# Patient Record
Sex: Female | Born: 1945 | Race: Black or African American | Hispanic: No | Smoking: Former smoker
Health system: Southern US, Community
[De-identification: ages and names within clinical notes are randomized; demographics above are authoritative.]

## PROBLEM LIST (undated history)

## (undated) DIAGNOSIS — Z79890 Hormone replacement therapy (postmenopausal): Secondary | ICD-10-CM

## (undated) DIAGNOSIS — M1990 Unspecified osteoarthritis, unspecified site: Secondary | ICD-10-CM

## (undated) DIAGNOSIS — R9431 Abnormal electrocardiogram [ECG] [EKG]: Secondary | ICD-10-CM

## (undated) DIAGNOSIS — R42 Dizziness and giddiness: Secondary | ICD-10-CM

## (undated) DIAGNOSIS — M259 Joint disorder, unspecified: Secondary | ICD-10-CM

## (undated) DIAGNOSIS — R51 Headache: Secondary | ICD-10-CM

## (undated) HISTORY — DX: Headache: R51

## (undated) HISTORY — DX: Abnormal electrocardiogram (ECG) (EKG): R94.31

## (undated) HISTORY — DX: Joint disorder, unspecified: M25.9

## (undated) HISTORY — PX: OTHER SURGICAL HISTORY: SHX169

## (undated) HISTORY — PX: CATARACT EXTRACTION: SUR2

## (undated) HISTORY — PX: ABDOMINAL HYSTERECTOMY: SHX81

## (undated) HISTORY — DX: Hormone replacement therapy: Z79.890

## (undated) HISTORY — PX: TUBAL LIGATION: SHX77

## (undated) HISTORY — DX: Dizziness and giddiness: R42

---

## 1996-08-14 ENCOUNTER — Encounter: Payer: Self-pay | Admitting: Internal Medicine

## 1999-03-26 ENCOUNTER — Encounter: Payer: Self-pay | Admitting: Internal Medicine

## 1999-03-26 ENCOUNTER — Ambulatory Visit (HOSPITAL_COMMUNITY): Admission: RE | Admit: 1999-03-26 | Discharge: 1999-03-26 | Payer: Self-pay | Admitting: Internal Medicine

## 2001-08-12 ENCOUNTER — Encounter: Payer: Self-pay | Admitting: Internal Medicine

## 2001-08-12 ENCOUNTER — Ambulatory Visit (HOSPITAL_COMMUNITY): Admission: RE | Admit: 2001-08-12 | Discharge: 2001-08-12 | Payer: Self-pay | Admitting: Internal Medicine

## 2004-11-14 ENCOUNTER — Ambulatory Visit: Payer: Self-pay | Admitting: Internal Medicine

## 2004-11-29 ENCOUNTER — Ambulatory Visit: Payer: Self-pay | Admitting: Internal Medicine

## 2005-02-16 ENCOUNTER — Ambulatory Visit: Payer: Self-pay | Admitting: Family Medicine

## 2005-02-16 ENCOUNTER — Ambulatory Visit (HOSPITAL_COMMUNITY): Admission: RE | Admit: 2005-02-16 | Discharge: 2005-02-16 | Payer: Self-pay | Admitting: Family Medicine

## 2005-09-24 ENCOUNTER — Ambulatory Visit: Payer: Self-pay | Admitting: Internal Medicine

## 2006-01-08 ENCOUNTER — Emergency Department (HOSPITAL_COMMUNITY): Admission: EM | Admit: 2006-01-08 | Discharge: 2006-01-08 | Payer: Self-pay

## 2006-01-12 ENCOUNTER — Emergency Department (HOSPITAL_COMMUNITY): Admission: EM | Admit: 2006-01-12 | Discharge: 2006-01-13 | Payer: Self-pay | Admitting: Emergency Medicine

## 2006-02-27 ENCOUNTER — Ambulatory Visit (HOSPITAL_BASED_OUTPATIENT_CLINIC_OR_DEPARTMENT_OTHER): Admission: RE | Admit: 2006-02-27 | Discharge: 2006-02-27 | Payer: Self-pay | Admitting: Orthopedic Surgery

## 2006-05-08 ENCOUNTER — Ambulatory Visit: Payer: Self-pay | Admitting: Internal Medicine

## 2007-01-14 ENCOUNTER — Telehealth: Payer: Self-pay | Admitting: Family Medicine

## 2007-01-19 DIAGNOSIS — E079 Disorder of thyroid, unspecified: Secondary | ICD-10-CM | POA: Insufficient documentation

## 2007-01-19 DIAGNOSIS — R519 Headache, unspecified: Secondary | ICD-10-CM | POA: Insufficient documentation

## 2007-01-19 DIAGNOSIS — R51 Headache: Secondary | ICD-10-CM

## 2007-01-20 ENCOUNTER — Ambulatory Visit: Payer: Self-pay | Admitting: Internal Medicine

## 2007-01-20 DIAGNOSIS — M549 Dorsalgia, unspecified: Secondary | ICD-10-CM | POA: Insufficient documentation

## 2007-01-20 DIAGNOSIS — R079 Chest pain, unspecified: Secondary | ICD-10-CM

## 2007-01-21 ENCOUNTER — Ambulatory Visit: Payer: Self-pay | Admitting: Internal Medicine

## 2007-01-22 ENCOUNTER — Telehealth: Payer: Self-pay | Admitting: Internal Medicine

## 2007-09-24 ENCOUNTER — Telehealth: Payer: Self-pay | Admitting: *Deleted

## 2007-10-01 ENCOUNTER — Ambulatory Visit: Payer: Self-pay | Admitting: Internal Medicine

## 2007-10-01 DIAGNOSIS — J069 Acute upper respiratory infection, unspecified: Secondary | ICD-10-CM | POA: Insufficient documentation

## 2007-11-27 ENCOUNTER — Ambulatory Visit: Payer: Self-pay | Admitting: Internal Medicine

## 2007-11-27 DIAGNOSIS — G4700 Insomnia, unspecified: Secondary | ICD-10-CM | POA: Insufficient documentation

## 2007-12-24 ENCOUNTER — Telehealth: Payer: Self-pay | Admitting: Internal Medicine

## 2008-03-15 ENCOUNTER — Ambulatory Visit (HOSPITAL_COMMUNITY): Admission: RE | Admit: 2008-03-15 | Discharge: 2008-03-15 | Payer: Self-pay | Admitting: Internal Medicine

## 2008-03-15 LAB — HM MAMMOGRAPHY

## 2008-06-11 ENCOUNTER — Ambulatory Visit (HOSPITAL_COMMUNITY): Admission: RE | Admit: 2008-06-11 | Discharge: 2008-06-11 | Payer: Self-pay | Admitting: Orthopedic Surgery

## 2008-08-25 ENCOUNTER — Ambulatory Visit: Payer: Self-pay | Admitting: Internal Medicine

## 2008-08-25 DIAGNOSIS — M7710 Lateral epicondylitis, unspecified elbow: Secondary | ICD-10-CM | POA: Insufficient documentation

## 2008-08-25 DIAGNOSIS — R030 Elevated blood-pressure reading, without diagnosis of hypertension: Secondary | ICD-10-CM

## 2008-08-25 LAB — CONVERTED CEMR LAB: Cholesterol, target level: 200 mg/dL

## 2009-03-02 ENCOUNTER — Encounter: Payer: Self-pay | Admitting: Internal Medicine

## 2009-03-15 ENCOUNTER — Encounter: Payer: Self-pay | Admitting: Internal Medicine

## 2009-10-20 ENCOUNTER — Ambulatory Visit: Payer: Self-pay | Admitting: Internal Medicine

## 2009-10-31 ENCOUNTER — Ambulatory Visit: Payer: Self-pay | Admitting: Internal Medicine

## 2009-11-01 ENCOUNTER — Encounter: Payer: Self-pay | Admitting: *Deleted

## 2009-11-01 LAB — CONVERTED CEMR LAB: Fecal Occult Bld: NEGATIVE

## 2010-07-05 NOTE — Letter (Signed)
Summary: [facility]: Immunoassay Fecal Occult Blood (iFOB) Order Form  [place] at [facility]  [address] [place], [place] [address]   Phone: [phone]  Fax: [phone]      [facility]: Immunoassay Fecal Occult Blood (iFOB) Order Form   Oct 20, 2009 MRN: [id number]   KYMIAH ARAIZA [birth year]   Physicican Name:_________________________  Diagnosis Code:__________________________      [name] MD

## 2010-07-05 NOTE — Letter (Signed)
Summary: Generic Letter  [place] at [facility]  [address] [place], [place] [address]   Phone: [phone]  Fax: [phone]    11/01/2009  Whitney Payne [address] [place], [place]  [address]  Dear [name],  Your Colon Cancer Screen was negative. If you have any questions, please give [place] a call at [phone].         Sincerely,   Tor Netters, CMA (AAMA)

## 2010-07-05 NOTE — Assessment & Plan Note (Signed)
Summary: follow up on meds/ssc   Vital Signs:  Patient profile:   65 year old female Menstrual status:  hysterectomy Height:      62.75 inches Weight:      146 pounds BMI:     26.16 BP sitting:   140 / 90  (right arm)  Contraindications/Deferment of Procedures/Staging:    Test/Procedure: Colonoscopy    Reason for deferment: patient declined   Serial Vital Signs/Assessments:  Time      Position  BP       Pulse  Resp  Temp     By                     138/88                         [name] MD  CC: Fu Meds, Headache   History of Present Illness: Whitney Payne comeisn for her yearly visit . Since last visit  here  there have been no major changes in health status  . 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 takes  clams  [place] ocass.  for sleep  . No obv sedation  Still  on hormones.    wants to avoid patches  because of   personal reasons.   Skipping  a couple of day at  times      and titrating  symptoms .  Joints :Ibuprofen    ocass   with knee  . OTherw ise no new medical problems    Preventive Screening-Counseling & Management  Alcohol-Tobacco     Alcohol drinks/day: <1     Smoking Status: quit     Smoking Cessation Counseling: yes     Year Quit: 20 years ago  Caffeine-Diet-Exercise     Caffeine use/day: 1     Does Patient Exercise: yes     Type of exercise: eliptical     Exercise (avg: min/session): <30     Times/week: 3  Hep-HIV-STD-Contraception     Dental Visit-last 6 months yes     Sun Exposure-Excessive: no  Safety-Violence-Falls     Seat Belt Use: yes     Firearms in the Home: no firearms in the home     Smoke Detectors: yes  Current Medications (verified): 1)  Ibuprofen 800 Mg  Tabs (Ibuprofen) .Marland Kitchen.. 1 Every 8 Hours As Needed 2)  Premarin 1.25 Mg  Tabs (Estrogens Conjugated) .Marland Kitchen.. 1 By Mouth Once Daily. 3)  Calcium 600/vitamin D 600-400 Mg-Unit  Tabs (Calcium Carbonate-Vitamin D) .... Daily 4)  Vitamin B-12 1000 Mcg  Tabs (Cyanocobalamin) ....  Daliy 5)  Ambien Cr 12.5 Mg  Tbcr (Zolpidem Tartrate) .Marland Kitchen.. 1 By Mouth Hs As Needed Sleep  Allergies (verified): No Known Drug Allergies  Past History:  Past medical, surgical, family and social histories (including risk factors) reviewed, and no changes noted (except as noted below).  Past Medical History: Reviewed history from 10/01/2007 and no changes required. Headache HRT  Past Surgical History: Reviewed history from 01/19/2007 and no changes required. Hysterectomy Tubal ligation Childbirth x 2  Family History: Reviewed history from 08/25/2008 and no changes required. non contributory no colon cancer Father: smoker  died form collapsed  lung  Mother: died of se of  patch narcotic and had artery disease  had amputation.   Siblings: 5    good   health.  75 no disease.    Social History: Reviewed history from 08/25/2008 and no  changes required. 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 exercising  ok.  Seat Belt Use:  yes Sun Exposure-Excessive:  no Dental Care w/in 6 mos.:  yes  Review of Systems  The patient denies anorexia, fever, weight loss, weight gain, vision loss, decreased hearing, hoarseness, chest pain, syncope, dyspnea on exertion, peripheral edema, prolonged cough, headaches, hemoptysis, abdominal pain, melena, hematochezia, severe indigestion/heartburn, hematuria, muscle weakness, suspicious skin lesions, transient blindness, difficulty walking, depression, unusual weight change, abnormal bleeding, enlarged lymph nodes, angioedema, and breast masses.         ocass headache   Physical Exam  General:  Well-developed,well-nourished,in no acute distress; alert,appropriate and cooperative throughout examination Head:  normocephalic and atraumatic.   Eyes:  vision grossly intact, pupils equal, pupils round, and pupils reactive to light.   Ears:  R ear normal and L ear normal.   Nose:  no external  deformity, no external erythema, and no nasal discharge.   Mouth:  pharynx pink and moist.   teeth in good repair Neck:  No deformities, masses, or tenderness noted. Lungs:  Normal respiratory effort, chest expands symmetrically. Lungs are clear to auscultation, no crackles or wheezes.no dullness.   Heart:  Normal rate and regular rhythm. S1 and S2 normal without gallop, murmur, click, rub or other extra sounds.no lifts.   Abdomen:  Bowel sounds positive,abdomen soft and non-tender without masses, organomegaly or hernias noted. Msk:  no acute changes  Pulses:  pulses intact without delay   Extremities:  no clubbing cyanosis or edema  Neurologic:  non focal alert & oriented X3, strength normal in all extremities, and gait normal.   Skin:  turgor normal, color normal, no suspicious lesions, no ecchymoses, no petechiae, and no purpura.   Cervical Nodes:  No lymphadenopathy noted Psych:  Normal eye contact, appropriate affect. Cognition appears normal.    Impression & Recommendations:  Problem # 1:  HRT (ICD-V07.4)  taking  every 3-4 days and controlls symptom .   Would like to remain ont his .   unable to use patch   as mom died from pathc delivery. Discussed risk benefit    related to HRT.   Orders: Prescription Created Electronically [phone])  Problem # 2:  SLEEPLESSNESS (ICD-780.52) not usng ,ed now  but otc with homepathic ingredient as needed.  Her updated medication list for this problem includes:    Ambien Cr 12.5 Mg Tbcr (Zolpidem tartrate) .Marland Kitchen... 1 by mouth hs as needed sleep  Problem # 3:  HEADACHE (ICD-784.0) Assessment: Unchanged t times  Her updated medication list for this problem includes:    Ibuprofen 800 Mg Tabs (Ibuprofen) .Marland Kitchen... 1 every 8 hours as needed  Problem # 4:  Preventive Health Care (ICD-V70.0) declining  colonscopy  but will do stool cared . also  getting reg mammogram.   disc routine wellness  via medicare .  she can schedue  Problem # 5:  INCREASED BLOOD  PRESSURE (ICD-796.2) has been better   monitoring   Problem # 6:  BACK PAIN (ICD-724.5) no alarm signs at present . Her updated medication list for this problem includes:    Ibuprofen 800 Mg Tabs (Ibuprofen) .Marland Kitchen... 1 every 8 hours as needed  Complete Medication List: 1)  Ibuprofen 800 Mg Tabs (Ibuprofen) .Marland Kitchen.. 1 every 8 hours as needed 2)  Premarin 1.25 Mg Tabs (Estrogens conjugated) .Marland Kitchen.. 1 by mouth once daily. 3)  Calcium 600/vitamin D 600-400 Mg-unit Tabs (Calcium carbonate-vitamin d) .... Daily 4)  Vitamin B-12 1000 Mcg Tabs (Cyanocobalamin) .... Daliy 5)  Ambien Cr 12.5 Mg Tbcr (Zolpidem tartrate) .Marland Kitchen.. 1 by mouth hs as needed sleep  Patient Instructions: 1)  Get a mammogram  2)  stool colon cancer screening test . 3)  check BP ocassionally to ensure normal . 4)  Consider getting  welcome to medicare physical if you want this  done. Prescriptions: PREMARIN 1.25 MG  TABS (ESTROGENS CONJUGATED) 1 by mouth once daily.  #30 Tablet x 12   Entered and Authorized by:   [name] MD   Signed by:   [name] MD on 10/20/2009   Method used:   Electronically to        [facility]  [id number]       [address]       [place], [place]  [address]       Ph: [phone] or [phone]       Fax: [phone]   RxID:   [id number] IBUPROFEN 800 MG  TABS (IBUPROFEN) 1 every 8 hours as needed  #40 x 4   Entered and Authorized by:   [name] MD   Signed by:   [name] MD on 10/20/2009   Method used:   [facility]ronically to        [facility]  [address] [id number]       [address]       [place], [place]  [address]       Ph: [phone] or [phone]       Fax: [phone]   RxID:   [id number]  greater than 50% of visit spent in counseling  regarding  rec for preventive parameters as well as use of hrt risk  etc.    declines labs for now .

## 2010-10-19 NOTE — Op Note (Signed)
NAME:  Whitney Payne, Whitney Payne NO.:  [id number]   MEDICAL RECORD NO.:  [id number]          PATIENT TYPE:  AMB   LOCATION:  [facility]                         [facility]ACILITY[facility]   PHYSICIAN:  Deidre Ala, M.D.    DATE OF BIRTH:  17-Mar-1946   DATE OF PROCEDURE:  02/27/2006  DATE OF DISCHARGE:                                 OPERATIVE REPORT   PREOPERATIVE DIAGNOSIS:  Left knee lateral meniscus tear.   POSTOPERATIVE DIAGNOSIS:  1. Degenerative lateral and medial meniscus tear.  2. 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  3. Tight lateral retinaculum, with lateral patellar tilt and track.  4. Medial and lateral plicas, with pouch synovitis.   OPERATION:  1. Left knee operative arthroscopy, with partial lateral and medial      meniscectomies.  2. Abrasion ablation and chondroplasties posterior patella, trochlea,      lateral compartment, and medial compartment.  3. Lateral retinacular release arthroscopically.  4. Plica excision tricompartment.   SURGEON:  1. Charlesetta Shanks, M.D.   ASSISTANT:  Clarene Reamer, PA-C.   ANESTHESIA:  General with LMA/.   CULTURES:  None.   DRAINS:  None.   ESTIMATED BLOOD LOSS:  Minimal.   TOURNIQUET TIME:  39 minutes.   PATHOLOGIC FINDINGS AND HISTORY:  Ms. Whitney Payne is a 65 year old sent for  consultation from the emergency room by Dr. Gray Bernhardt.  She had a  plant/twist injury to her left knee.  Stood up wrong off a bench about 8  days prior to being seen.  She has had catching, locking sensation in her  lateral knee and was given a knee immobilizer.  She had some giving way  symptoms.  Preoperative x-rays were normal.  I felt that this exam was  consistent in history with a lateral meniscus tear, possibly a flap or a  bucket handle.  We a long discussion on January 29, 2006, using anatomic  models and drawings.  We talked about getting an MRI scan.   She was  reluctant to go there.  She felt like she just wanted surgical intervention.  I told that there was a high probability that this was a displaceable  lateral meniscus tear.  We talked to her about bracing. She decided to go  ahead with diagnostic and operative arthroscopies.  The patient at surgery  had a marked degenerative stellate lateral meniscus tear from front to back,  with grade 3-4 degenerative changes, with some areas of denuded bone  centrally.  This was also true of the lateral femoral condyle.  ACL was  intact.  There was a small cartilaginous loose body at the superior notch  which we removed. The medial meniscus had a middle and posterior horn inner  rim degenerative tear.  There were large plicas both sides of the joint,  with rubbing on the medial femoral condyle.  There was a dime-sized defect  in the trochlea, grade 3.  There was a tight lateral retinaculum.  There was  a soft posterior patella, but the  rest of the patellar cartilage was  basically intact.  There was pouch synovitis.  All this was debrided,  ablated, and smoothed with a lateral retinacular release carried out and  partial medial lateral meniscectomies.   PROCEDURE:  With adequate anesthesia obtained using LMA technique, 1 g Ancef  given for IV prophylaxis, and the patient was placed in the supine position.  The left lower extremity was prepped from the malleoli to the leg holder in  the standard fashion.  After standard prepping and draping, Esmarch  exsanguination was used. The tourniquet was let up to 350 mmHg.  Superior  lateral inflow portal was made. The knee was insufflated with normal saline  with an arthroscopic pump. Medial and lateral scope portals were then made,  and the joint was thoroughly inspected.  I then shaved out the medial plica  back to the sidewall and lysed the medial band.  I used the ablator.  I  wanted to smooth the defect on the medial femoral condyle after light   shaving.  I then checked the lateral meniscus, and through two different  portal angles,  I used a basket and shaver to saucerize the meniscus back to  a stable rim, and the tearing was quite extensive from front to back.  Ablator was used to smooth the edge as well as on the lateral tibial plateau  and lateral femoral condyle.  I completed the same maneuver on the posterior  medial meniscus.  Portals reversed.  I then shaved out the lateral plica  back to the sidewall.  I then observed tilt and track.  I then did an  arthroscopic lateral retinacular release from vastus lateralis to the joint  line, with bleeding points cauterized, with improvement in tilt and track.  Pouch synovitis was then debrided.  The knee was then irrigated through the  scope; 0.5% Marcaine with morphine was injected. Portals were left open.  A  bulky sterile compressive dressing was applied with lateral foam pad for  tamponade and EZ Wrap placed.  The patient then, having tolerated the  procedure well, was awakened and taken to recovery room in satisfactory  condition, to be discharged per outpatient routine, given Percocet for pain,  and told to call the office for recheck tomorrow.           ______________________________  V. Charlesetta Shanks, M.D.     VEP/MEDQ  D:  02/27/2006  T:  02/28/2006  Job:  [id number]   cc:   Neta Mends. Fabian Sharp, MD  [address] [place]  [place] [address]   [place] Jordan L. Effie Shy, M.D.  Fax: [phone]

## 2010-10-19 NOTE — Assessment & Plan Note (Signed)
[facility]                                 ON-CALL NOTE   NAME:[name], [name]                      MRN:          [id number]  DATE:09/02/2006                            DOB:          August 07, 1945    PHONE DICTATION:  Patient of Dr. Fabian Sharp.  Phone call comes from Mrs.  Serrette, her daughter, at [phone].  Phone call was at 7:55 a.m. on  April 1.  Ms. Early is having pain in her right side.  Her daughter  states that she really does not want to go to the emergency room, just  wanted to talk to someone and make some plans at this point.   PLAN:  I spoke to Baker at [place] who is Dr. Rosezella [place] nurse and  she was going to call her to make arrangements for evaluation today.     Karie Schwalbe, MD  Electronically Signed    RIL/MedQ  DD: 09/02/2006  DT: 09/02/2006  Job #: [id number]   cc:   Neta Mends. Fabian Sharp, MD

## 2010-10-24 ENCOUNTER — Other Ambulatory Visit: Payer: Self-pay | Admitting: Internal Medicine

## 2010-10-24 NOTE — Telephone Encounter (Signed)
Per Dr. Fabian Sharp- ok x 1- Pt needs to schedule a follow up appt before next refill.

## 2010-11-06 ENCOUNTER — Other Ambulatory Visit: Payer: Self-pay | Admitting: Internal Medicine

## 2010-11-13 ENCOUNTER — Encounter: Payer: Self-pay | Admitting: Internal Medicine

## 2010-11-27 ENCOUNTER — Ambulatory Visit: Payer: Self-pay | Admitting: Internal Medicine

## 2011-02-05 ENCOUNTER — Other Ambulatory Visit: Payer: Self-pay | Admitting: Internal Medicine

## 2011-02-05 NOTE — Telephone Encounter (Signed)
LOV 10/20/09 Pt cancelled last appointment. NOV- none

## 2011-02-12 ENCOUNTER — Other Ambulatory Visit: Payer: Self-pay | Admitting: *Deleted

## 2011-02-12 MED ORDER — IBUPROFEN 800 MG PO TABS: ORAL_TABLET | ORAL | Status: DC

## 2011-02-12 NOTE — Telephone Encounter (Signed)
Refill on Ibuprofen 800mg  last filled on 10/24/10 #40 LOV 10/20/09 NOV none

## 2011-05-01 ENCOUNTER — Other Ambulatory Visit: Payer: Self-pay | Admitting: Internal Medicine

## 2011-11-21 ENCOUNTER — Encounter: Payer: Self-pay | Admitting: Internal Medicine

## 2011-11-21 ENCOUNTER — Ambulatory Visit (INDEPENDENT_AMBULATORY_CARE_PROVIDER_SITE_OTHER): Payer: Medicare Other | Admitting: Internal Medicine

## 2011-11-21 VITALS — BP 160/90 | HR 101 | Temp 98.5°F | Wt 142.0 lb

## 2011-11-21 DIAGNOSIS — G4700 Insomnia, unspecified: Secondary | ICD-10-CM

## 2011-11-21 DIAGNOSIS — R21 Rash and other nonspecific skin eruption: Secondary | ICD-10-CM | POA: Insufficient documentation

## 2011-11-21 DIAGNOSIS — Z79890 Hormone replacement therapy (postmenopausal): Secondary | ICD-10-CM

## 2011-11-21 DIAGNOSIS — R030 Elevated blood-pressure reading, without diagnosis of hypertension: Secondary | ICD-10-CM

## 2011-11-21 MED ORDER — ESTROGENS CONJUGATED 1.25 MG PO TABS: 1.2500 mg | ORAL_TABLET | Freq: Every day | ORAL | Status: DC

## 2011-11-21 NOTE — Progress Notes (Signed)
  Subjective:    Patient ID: Whitney Payne, female    DOB: 1945/06/06, 66 y.o.   MRN: [id number]  HPI Pt comesin for " fu"  Area on leg  but last OV was over 2 years ago. No major changes ; ,injury surgery or hospitalizations. But did have cataract surgery . Non tender or itchy area  Left lower leg  Used local cleanser and etoh. No pain ? If got bug bite. Wants refill premarin taking 1.25 about every other day doesn't want to change to patch( see fam hx )  Nor dosing at this time hasnt got a mammo  Declining colonoscopy. Feels well insomnia  About 5 hours of sleep for a long time  Takes otc some. 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  Review of Systems ROS:  GEN/ HEENT: No fever, significant weight changes sweats headaches vision problems hearing changes, CV/ PULM; No  shortness of breath cough, syncope,edema  change in exercise tolerance.has had ocass fleetin left upper chest discomfort that lasts seconds and no associated sx  GI /GU: No adominal pain, vomiting, change in bowel habits. No blood in the stool. No significant GU symptoms. SKIN/HEME: ,no  suspicious lesions or bleeding. No lymphadenopathy, nodules, masses.  NEURO/ PSYCH:  No neurologic signs such as weakness numbness. No depression anxiety. IMM/ Allergy: No unusual infections.  Allergy .   REST of 12 system review negative except as per HPI Right knee bothers her at times  Past history family history social history reviewed in the electronic medical record.     Objective:   Physical Exam BP 160/90  Pulse 101  Temp 98.5 F (36.9 C) (Oral)  Wt 142 lb (64.411 kg)  SpO2 98% Repeat BP right sitting 155/88  WDWN in nad HEENT: Normocephalic ;atraumatic , Eyes;  PERRL, EOMs  Full, lids and conjunctiva clear,,Ears: no deformities, canals nl, TM landmarks normal, Nose: no deformity or discharge  Mouth : OP clear without lesion or edema . Neck: Supple without adenopathy or masses or  bruits Chest:  Clear to A without wheezes rales or rhonchi CV:  S1-S2 no gallops or murmurs peripheral perfusion is normal Skin: normal capillary refill ,turgor , color: No  ,petechiae or bruising  There are thing linear healing abrasion like  Areas left leg in a row no vesicle or blisters non tender or red Abdomen:  Sof,t normal bowel sounds without hepatosplenomegaly, no guarding rebound or masses no CVA tenderness    Assessment & Plan:  Leg rash  Seems minor at this time and follow up if alarm features  Elevated Bp readings  Check and get monitor  Would need follow up or treatment in a months or so.  Get her own machine to check her readings have been up before but come down otherwise.she will need to be on meds if still elevated . Seems to have a white coat effect.  No reg fu . Declining colonoscopy  And" hasnt got to" her mammo. HRT want to stay on dose and decrease on her own.  Will refill but needs at least yearly visit   Risk benefit of medication discussed. Sleep issues on going I suspect she has anxiety and  Is busy  Sleep hygiene  Good exercise . (She tends to come for  Problem focused visit but  Doesn't have time for  Routine preventive monitoring visits.)

## 2011-11-21 NOTE — Patient Instructions (Addendum)
I think the area on the leg will resolve on its  own without serious problem. Can use antibiotic ointment and contact us of swelling or more problem.  Get a mammogram.  Your blood pressure is elevated today. Gets her readings checked prefer having her on machine around the arm rest a few minutes before taking the reading. Check at least 3 separate readings on 3 separate days about 3 times a week would be good.  If the average is 140/90 and above it is considered elevated. Contact us with your readings by fax phone or mail  after 3-4 weeks to decide on next step.  The DASH diet is very good for a pressure reduction but medication may be indicated if your readings stay up.   DASH Diet The DASH diet stands for "Dietary Approaches to Stop Hypertension." It is a healthy eating plan that has been shown to reduce high blood pressure (hypertension) in as little as 14 days, while also possibly providing other significant health benefits. These other health benefits include reducing the risk of breast cancer after menopause and reducing the risk of type 2 diabetes, heart disease, colon cancer, and stroke. Health benefits also include weight loss and slowing kidney failure in patients with chronic kidney disease.  DIET GUIDELINES  Limit salt (sodium). Your diet should contain less than 1500 mg of sodium daily.   Limit refined or processed carbohydrates. Your diet should include mostly whole grains. Desserts and added sugars should be used sparingly.   Include small amounts of heart-healthy fats. These types of fats include nuts, oils, and tub margarine. Limit saturated and trans fats. These fats have been shown to be harmful in the body.  CHOOSING FOODS  The following food groups are based on a 2000 calorie diet. See your Registered Dietitian for individual calorie needs. Grains and Grain Products (6 to 8 servings daily)  Eat More Often: Whole-wheat bread, brown rice, whole-grain or wheat pasta,  quinoa, popcorn without added fat or salt (air popped).   Eat Less Often: White bread, white pasta, white rice, cornbread.  Vegetables (4 to 5 servings daily)  Eat More Often: Fresh, frozen, and canned vegetables. Vegetables may be raw, steamed, roasted, or grilled with a minimal amount of fat.   Eat Less Often/Avoid: Creamed or fried vegetables. Vegetables in a cheese sauce.  Fruit (4 to 5 servings daily)  Eat More Often: All fresh, canned (in natural juice), or frozen fruits. Dried fruits without added sugar. One hundred percent fruit juice ( cup [237 mL] daily).   Eat Less Often: Dried fruits with added sugar. Canned fruit in light or heavy syrup.  [employer], Fish, and Poultry (2 servings or less daily. One serving is 3 to 4 oz [85-114 g]).  Eat More Often: Ninety percent or leaner ground beef, tenderloin, sirloin. Round cuts of beef, chicken breast, [place] breast. All fish. Grill, bake, or broil your meat. Nothing should be fried.   Eat Less Often/Avoid: Fatty cuts of meat, [place], or chicken leg, thigh, or wing. Fried cuts of meat or fish.  Dairy (2 to 3 servings)  Eat More Often: Low-fat or fat-free milk, low-fat plain or light yogurt, reduced-fat or part-skim cheese.   Eat Less Often/Avoid: Milk (whole, 2%, skim, or chocolate).Whole milk yogurt. Full-fat cheeses.  Nuts, Seeds, and Legumes (4 to 5 servings per week)  Eat More Often: All without added salt.   Eat Less Often/Avoid: Salted nuts and seeds, canned beans with added salt.  Fats and  Sweets (limited)  Eat More Often: Vegetable oils, tub margarines without trans fats, sugar-free gelatin. Mayonnaise and salad dressings.   Eat Less Often/Avoid: Coconut oils, palm oils, butter, stick margarine, cream, half and half, cookies, candy, pie.  FOR MORE INFORMATION The Dash Diet Eating Plan: www.dashdiet.org Document Released: 05/09/2011 Document Reviewed: 04/29/2011 [facility] Patient Information 2012 [place], [place].Arterial  Hypertension Arterial hypertension (high blood pressure) is a condition of elevated pressure in your blood vessels. Hypertension over a long period of time is a risk factor for strokes, heart attacks, and heart failure. It is also the leading cause of kidney (renal) failure.  CAUSES   In Adults -- Over 90% of all hypertension has no known cause. This is called essential or primary hypertension. In the other 10% of people with hypertension, the increase in blood pressure is caused by another disorder. This is called secondary hypertension. Important causes of secondary hypertension are:   Heavy alcohol use.   Obstructive sleep apnea.   Hyperaldosterosim (Conn's syndrome).   Steroid use.   Chronic kidney failure.   Hyperparathyroidism.   Medications.   Renal artery stenosis.   Pheochromocytoma.   Cushing's disease.   Coarctation of the aorta.   Scleroderma renal crisis.   Licorice (in excessive amounts).   Drugs (cocaine, methamphetamine).  Your caregiver can explain any items above that apply to you.  In Children -- Secondary hypertension is more common and should always be considered.   Pregnancy -- Few women of childbearing age have high blood pressure. However, up to 10% of them develop hypertension of pregnancy. Generally, this will not harm the woman. It may be a sign of 3 complications of pregnancy: preeclampsia, HELLP syndrome, and eclampsia. Follow up and control with medication is necessary.  SYMPTOMS   This condition normally does not produce any noticeable symptoms. It is usually found during a routine exam.   Malignant hypertension is a late problem of high blood pressure. It may have the following symptoms:   Headaches.   Blurred vision.   End-organ damage (this means your kidneys, heart, lungs, and other organs are being damaged).   Stressful situations can increase the blood pressure. If a person with normal blood pressure has their blood pressure go up  while being seen by their caregiver, this is often termed "white coat hypertension." Its importance is not known. It may be related with eventually developing hypertension or complications of hypertension.   Hypertension is often confused with mental tension, stress, and anxiety.  DIAGNOSIS  The diagnosis is made by 3 separate blood pressure measurements. They are taken at least 1 week apart from each other. If there is organ damage from hypertension, the diagnosis may be made without repeat measurements. Hypertension is usually identified by having blood pressure readings:  Above 140/90 mmHg measured in both arms, at 3 separate times, over a couple weeks.   Over 130/80 mmHg should be considered a risk factor and may require treatment in patients with diabetes.  Blood pressure readings over 120/80 mmHg are called "pre-hypertension" even in non-diabetic patients. To get a true blood pressure measurement, use the following guidelines. Be aware of the factors that can alter blood pressure readings.  Take measurements at least 1 hour after caffeine.   Take measurements 30 minutes after smoking and without any stress. This is another reason to quit smoking - it raises your blood pressure.   Use a proper cuff size. Ask your caregiver if you are not sure about your cuff size.  Most home blood pressure cuffs are automatic. They will measure systolic and diastolic pressures. The systolic pressure is the pressure reading at the start of sounds. Diastolic pressure is the pressure at which the sounds disappear. If you are elderly, measure pressures in multiple postures. Try sitting, lying or standing.   Sit at rest for a minimum of 5 minutes before taking measurements.   You should not be on any medications like decongestants. These are found in many cold medications.   Record your blood pressure readings and review them with your caregiver.  If you have hypertension:  Your caregiver may do tests to  be sure you do not have secondary hypertension (see "causes" above).   Your caregiver may also look for signs of metabolic syndrome. This is also called Syndrome X or Insulin Resistance Syndrome. You may have this syndrome if you have type 2 diabetes, abdominal obesity, and abnormal blood lipids in addition to hypertension.   Your caregiver will take your medical and family history and perform a physical exam.   Diagnostic tests may include blood tests (for glucose, cholesterol, potassium, and kidney function), a urinalysis, or an EKG. Other tests may also be necessary depending on your condition.  PREVENTION  There are important lifestyle issues that you can adopt to reduce your chance of developing hypertension:  Maintain a normal weight.   Limit the amount of salt (sodium) in your diet.   Exercise often.   Limit alcohol intake.   Get enough potassium in your diet. Discuss specific advice with your caregiver.   Follow a DASH diet (dietary approaches to stop hypertension). This diet is rich in fruits, vegetables, and low-fat dairy products, and avoids certain fats.  PROGNOSIS  Essential hypertension cannot be cured. Lifestyle changes and medical treatment can lower blood pressure and reduce complications. The prognosis of secondary hypertension depends on the underlying cause. Many people whose hypertension is controlled with medicine or lifestyle changes can live a normal, healthy life.  RISKS AND COMPLICATIONS  While high blood pressure alone is not an illness, it often requires treatment due to its short- and long-term effects on many organs. Hypertension increases your risk for:  CVAs or strokes (cerebrovascular accident).   Heart failure due to chronically high blood pressure (hypertensive cardiomyopathy).   Heart attack (myocardial infarction).   Damage to the retina (hypertensive retinopathy).   Kidney failure (hypertensive nephropathy).  Your caregiver can explain list  items above that apply to you. Treatment of hypertension can significantly reduce the risk of complications. TREATMENT   For overweight patients, weight loss and regular exercise are recommended. Physical fitness lowers blood pressure.   Mild hypertension is usually treated with diet and exercise. A diet rich in fruits and vegetables, fat-free dairy products, and foods low in fat and salt (sodium) can help lower blood pressure. Decreasing salt intake decreases blood pressure in a 1/3 of people.   Stop smoking if you are a smoker.  The steps above are highly effective in reducing blood pressure. While these actions are easy to suggest, they are difficult to achieve. Most patients with moderate or severe hypertension end up requiring medications to bring their blood pressure down to a normal level. There are several classes of medications for treatment. Blood pressure pills (antihypertensives) will lower blood pressure by their different actions. Lowering the blood pressure by 10 mmHg may decrease the risk of complications by as much as 25%. The goal of treatment is effective blood pressure control. This will reduce your  risk for complications. Your caregiver will help you determine the best treatment for you according to your lifestyle. What is excellent treatment for one person, may not be for you. HOME CARE INSTRUCTIONS   Do not smoke.   Follow the lifestyle changes outlined in the "Prevention" section.   If you are on medications, follow the directions carefully. Blood pressure medications must be taken as prescribed. Skipping doses reduces their benefit. It also puts you at risk for problems.   Follow up with your caregiver, as directed.   If you are asked to monitor your blood pressure at home, follow the guidelines in the "Diagnosis" section above.  SEEK MEDICAL CARE IF:   You think you are having medication side effects.   You have recurrent headaches or lightheadedness.   You have  swelling in your ankles.   You have trouble with your vision.  SEEK IMMEDIATE MEDICAL CARE IF:   You have sudden onset of chest pain or pressure, difficulty breathing, or other symptoms of a heart attack.   You have a severe headache.   You have symptoms of a stroke (such as sudden weakness, difficulty speaking, difficulty walking).  MAKE SURE YOU:   Understand these instructions.   Will watch your condition.   Will get help right away if you are not doing well or get worse.  Document Released: 05/20/2005 Document Revised: 05/09/2011 Document Reviewed: 12/18/2006 [facility] Patient Information 2012 [place], [place].    Insomnia Insomnia is frequent trouble falling and/or staying asleep. Insomnia can be a long term problem or a short term problem. Both are common. Insomnia can be a short term problem when the wakefulness is related to a certain stress or worry. Long term insomnia is often related to ongoing stress during waking hours and/or poor sleeping habits. Overtime, sleep deprivation itself can make the problem worse. Every little thing feels more severe because you are overtired and your ability to cope is decreased. CAUSES   Stress, anxiety, and depression.   Poor sleeping habits.   Distractions such as TV in the bedroom.   Naps close to bedtime.   Engaging in emotionally charged conversations before bed.   Technical reading before sleep.   Alcohol and other sedatives. They may make the problem worse. They can hurt normal sleep patterns and normal dream activity.   Stimulants such as caffeine for several hours prior to bedtime.   Pain syndromes and shortness of breath can cause insomnia.   Exercise late at night.   Changing time zones may cause sleeping problems (jet lag).  It is sometimes helpful to have someone observe your sleeping patterns. They should look for periods of not breathing during the night (sleep apnea). They should also look to see how long those  periods last. If you live alone or observers are uncertain, you can also be observed at a sleep clinic where your sleep patterns will be professionally monitored. Sleep apnea requires a checkup and treatment. Give your caregivers your medical history. Give your caregivers observations your family has made about your sleep.  SYMPTOMS   Not feeling rested in the morning.   Anxiety and restlessness at bedtime.   Difficulty falling and staying asleep.  TREATMENT   Your caregiver may prescribe treatment for an underlying medical disorders. Your caregiver can give advice or help if you are using alcohol or other drugs for self-medication. Treatment of underlying problems will usually eliminate insomnia problems.   Medications can be prescribed for short time use. They are  generally not recommended for lengthy use.   Over-the-counter sleep medicines are not recommended for lengthy use. They can be habit forming.   You can promote easier sleeping by making lifestyle changes such as:   Using relaxation techniques that help with breathing and reduce muscle tension.   Exercising earlier in the day.   Changing your diet and the time of your last meal. No night time snacks.   Establish a regular time to go to bed.   Counseling can help with stressful problems and worry.   Soothing music and white noise may be helpful if there are background noises you cannot remove.   Stop tedious detailed work at least one hour before bedtime.  HOME CARE INSTRUCTIONS   Keep a diary. Inform your caregiver about your progress. This includes any medication side effects. See your caregiver regularly. Take note of:   Times when you are asleep.   Times when you are awake during the night.   The quality of your sleep.   How you feel the next day.  This information will help your caregiver care for you.  Get out of bed if you are still awake after 15 minutes. Read or do some quiet activity. Keep the lights  down. Wait until you feel sleepy and go back to bed.   Keep regular sleeping and waking hours. Avoid naps.   Exercise regularly.   Avoid distractions at bedtime. Distractions include watching television or engaging in any intense or detailed activity like attempting to balance the household checkbook.   Develop a bedtime ritual. Keep a familiar routine of bathing, brushing your teeth, climbing into bed at the same time each night, listening to soothing music. Routines increase the success of falling to sleep faster.   Use relaxation techniques. This can be using breathing and muscle tension release routines. It can also include visualizing peaceful scenes. You can also help control troubling or intruding thoughts by keeping your mind occupied with boring or repetitive thoughts like the old concept of counting sheep. You can make it more creative like imagining planting one beautiful flower after another in your backyard garden.   During your day, work to eliminate stress. When this is not possible use some of the previous suggestions to help reduce the anxiety that accompanies stressful situations.  MAKE SURE YOU:   Understand these instructions.   Will watch your condition.   Will get help right away if you are not doing well or get worse.  Document Released: 05/17/2000 Document Revised: 05/09/2011 Document Reviewed: 06/17/2007 [facility] Patient Information 2012 [place], [place].

## 2011-12-03 ENCOUNTER — Telehealth: Payer: Self-pay | Admitting: Internal Medicine

## 2011-12-03 NOTE — Telephone Encounter (Signed)
Pt called and wanted to give bp readings:  (11/26/11 in a.m was 121/88 hr 77, 132/88 hr 80, 133/95 hr 82, Avg was 128/90 hr 79)    (11/27/11 in a.m. was 126/90 hr 82,  112/74 hr 84,  126/97 hr 77,  Avg 121/87 hr 81) (11/28/11 in a.m. was 104/74 hr 80,  110/84 hr 80,  118/88 hr 88,  Avg 138/100 hr 100)  (12/01/11 in a.m was  132/92 hr 81,  122/90 hr 82,  116/76 hr 83,  Avg 138/100 hr 82)

## 2011-12-03 NOTE — Telephone Encounter (Signed)
Tell pt that the diastoiic is high  OPTION 1:  Check readings through July   About 3 x per week.  Can fax in or call readings  And if diastolic still elevated we can begin  Low dose  Lisinopril of amlodipine.  OPTION 2 : we can begin low dose medication and ROV in 6- 8 weeks.

## 2011-12-04 NOTE — Telephone Encounter (Signed)
Spoke to the pt.  At this time she would like to check her pressure through the month of July and give [place] a call at the end of the month.  Instructed her to call if BP is really high.  She will check BP 3 times weekly and will call.

## 2012-07-08 ENCOUNTER — Encounter: Payer: Self-pay | Admitting: Internal Medicine

## 2012-07-08 ENCOUNTER — Ambulatory Visit (INDEPENDENT_AMBULATORY_CARE_PROVIDER_SITE_OTHER): Payer: Medicare Other | Admitting: Internal Medicine

## 2012-07-08 VITALS — BP 138/86 | HR 113 | Temp 98.0°F | Wt 148.0 lb

## 2012-07-08 DIAGNOSIS — Z79890 Hormone replacement therapy (postmenopausal): Secondary | ICD-10-CM

## 2012-07-08 DIAGNOSIS — R030 Elevated blood-pressure reading, without diagnosis of hypertension: Secondary | ICD-10-CM

## 2012-07-08 DIAGNOSIS — N951 Menopausal and female climacteric states: Secondary | ICD-10-CM | POA: Insufficient documentation

## 2012-07-08 DIAGNOSIS — Z79899 Other long term (current) drug therapy: Secondary | ICD-10-CM | POA: Insufficient documentation

## 2012-07-08 LAB — BASIC METABOLIC PANEL
Calcium: 9.2 mg/dL (ref 8.4–10.5)
Creatinine, Ser: 0.8 mg/dL (ref 0.4–1.2)
GFR: 94.44 mL/min (ref >60.00)
Sodium: 138 mEq/L (ref 135–145)

## 2012-07-08 LAB — LIPID PANEL
Cholesterol: 184 mg/dL (ref 0–200)
HDL: 82.9 mg/dL (ref >39.00)
Triglycerides: 126 mg/dL (ref 0.0–149.0)

## 2012-07-08 MED ORDER — IBUPROFEN 800 MG PO TABS: ORAL_TABLET | ORAL | Status: DC

## 2012-07-08 MED ORDER — ESTROGENS CONJUGATED 1.25 MG PO TABS: 1.2500 mg | ORAL_TABLET | Freq: Every day | ORAL | Status: DC

## 2012-07-08 NOTE — Patient Instructions (Signed)
Will notify you  of labs when available. Continue to monitor your Blood pressures . Contact us if regularly more than    Get prices .    Of various doses  .    And we can write any way helpful  Will write a letter but  Am not hopeful they will change their opinion.

## 2012-07-08 NOTE — Progress Notes (Signed)
Chief Complaint  Patient presents with  . Follow-up    HPI: Comes in for follow up of   Medical management and the fact that  Received letter that her insurance will not pay for estrogen   Instead offers substitution of  bisphosphonates and ssri  To substitute this.   ( alendronate paxil and effexor)  She has been on hrt for many years and  States she has done the best with this cause of  hot flashes when she goes more than 2 days off from and cannot tolerate patch. She feels well; it helps with her insomnia. 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   bp readings are good. At home in the 130s and below has sent in readings before. No family history of hypertension  that she is aware of. No chest pain shortness of breath neurologic symptoms or breast lumps.   ROS: See pertinent positives and negatives per HPI. Nonsmoker no bleeding  Past Medical History  Diagnosis Date  . Headache   . Postmenopausal HRT (hormone replacement therapy)   . Knee problem     Family History  Problem Relation Age of Onset  . Other Mother     died of se of patch narcotic, had artery diesease and amputation  . Other Father     collapsed lung- smoker    History   Social History  . Marital Status: Legally Separated    Spouse Name: N/A    Number of Children: N/A  . Years of Education: N/A   Social History Main Topics  . Smoking status: Former Games developer  . Smokeless tobacco: None  . Alcohol Use:   . Drug Use:   . Sexually Active:    Other Topics Concern  . 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 Calcium Carbonate-Vitamin D (CALCIUM + D PO) Take by mouth.         . estrogens, conjugated, (PREMARIN) 1.25 MG tablet Take 1 tablet (1.25 mg total) by mouth daily.  30 tablet  6  . ibuprofen (ADVIL,MOTRIN) 800 MG tablet 1 tid as needed for knee pain.  40 tablet  2  . vitamin B-12 (CYANOCOBALAMIN) 100 MCG tablet Take 100 mcg by mouth daily.        . [DISCONTINUED] estrogens, conjugated, (PREMARIN) 1.25 MG tablet Take 1 tablet (1.25 mg total) by mouth daily.  30 tablet  11  . [DISCONTINUED] ibuprofen (ADVIL,MOTRIN) 800 MG tablet 1 tid. Pt needs to schedule a follow up appt before next refill.  40 tablet  0    EXAM:  BP 138/86  Pulse 113  Temp 98 F (36.7 C) (Oral)  Wt 148 lb (67.132 kg)  SpO2 95%  There is no height on file to calculate BMI.  GENERAL: vitals reviewed and listed above, alert, oriented, appears well hydrated and in no acute distress  HEENT: atraumatic, conjunctiva  clear, no obvious abnormalities on inspection of external nose and ears NECK: no obvious masses on inspection palpation  No bruits   CV: HRRR, no clubbing cyanosis or  peripheral edema nl cap refill   MS: moves all extremities without noticeable focal  abnormality  PSYCH: pleasant and cooperative, no obvious depression or anxiety  ASSESSMENT AND PLAN:  Discussed the following assessment and plan:  1. Menopausal hot flushes  Basic metabolic panel, Lipid panel   Controlled by medication feels that she does best on this dose in preparation and is very concerned about insurance not covering any estrogen.  2. Postmenopausal HRT (hormone replacement therapy)  Basic metabolic panel, Lipid panel   4 hot flashes and sleep  3. Medication management  Basic metabolic panel, Lipid panel  disc  lowest dose needed for sx  .Marland Kitchen  Risk benefit of medication discussed.  No personal family history of blood clotting she is physically active in very good health. Blood pressure readings although up in the office at times has been documented to be normal at home we did discuss treatment of  hypertension if this does develop risk of anti-inflammatories she will not be taking these on a regular basis but will take them as needed for her knee pain. Risk benefit of all medicines discussed. At this time I would be more concerned about using Paxil and Effexor than  estrogen in this particular lady. And this is not a rational substitution at this time. She will look into prices of estrogen on her own; I will write a letter to the insurance company.    Lab screening today BMP and lipids will notify her of results. Reviewed healthcare maintenance issues mammograms and colonoscopy advice  -Patient advised to return or notify health care team  if symptoms worsen or persist or new concerns arise.  Patient Instructions  Will notify you  of labs when available. Continue to monitor your Blood pressures . Contact us if regularly more than    Get prices .    Of various doses  .    And we can write any way helpful  Will write a letter but  Am not hopeful they will change their opinion.    Neta Mends. Panosh M.D.

## 2012-07-10 ENCOUNTER — Telehealth: Payer: Self-pay | Admitting: Family Medicine

## 2012-07-10 NOTE — Telephone Encounter (Signed)
I'm okay to try this please call pharmacy for directions on how to order this.

## 2012-07-10 NOTE — Telephone Encounter (Signed)
The patient called and I spoke to her.  She said she spoke to [employer] and [employer] and they can make a compound of her Premarin at a much cheaper cost.  They call their mixture Bio individual.  The pharmacy number is [phone].  Please advise if you would like to proceed with this.  Thanks!!

## 2012-07-10 NOTE — Telephone Encounter (Signed)
I called and spoke with Jasmine December, a pharmacist at [facility].  She states Dr Fabian Sharp would need to order the dose for her pt.  This can be determined by lab work to determine blood hormone/estrogen levels, OR pt could go to [facility] and pick up and see Tamela Oddi, pharmacist who specializes in this and pick up a saliva test.  Tamela Oddi would get the results to Dr Fabian Sharp who would then give [facility] the dose.  Pt questioned if the blood she gave on Wed. Could still be used for any special testing.  I told her probably not as they usually keep blood maybe 24-48 hours and its been 3 days and its 5:45 pm on a Friday night.  Pt will go to [facility] for saliva test if that is what Dr Fabian Sharp wants her to do.  She also started talking about the Prior Auth for she gave to Dr Fabian Sharp.

## 2012-07-14 NOTE — Telephone Encounter (Signed)
I am ok with doing the cream if the patient is willing .   Where does she apply this. ?    Whitney Payne also please make sure we tell pt to get a routine mammogram  since she is on estrogen and she is overdue.    Also  Offer her stool hemoccult cards since not doing colonoscopy.

## 2012-07-14 NOTE — Telephone Encounter (Signed)
I spoke to [facility] the pharmacist.  She suggests you do one of two things.  First, an rx for estradiol tablet at a higher dose of 1mg .  She said there is no straight conversion from the premarin to the estradiol tablet.  May need to play with the mg.  The other option being a Biest cream 5mg /ml.  This contains both estradiol and estriol.  Using half a ml daily.  Kriste Basque seemed to think that the cream would work better since it contained both medications.  Kriste Basque said if you have any further questions, you can call her.  Please advise.  Thanks!!

## 2012-07-21 NOTE — Telephone Encounter (Signed)
Called and spoke to [place].  She stated could do Biest orally.  Prescription should read  Biest 8/2 1mg  capsule Take 1 by mouth daily

## 2012-07-22 ENCOUNTER — Other Ambulatory Visit: Payer: Self-pay | Admitting: Family Medicine

## 2012-07-22 NOTE — Telephone Encounter (Signed)
Patient notified that rx has been faxed to the pharmacy.  Sent stool card in the mail on 07/21/12 and the pt stated she will call to make mammo appt.

## 2012-07-22 NOTE — Telephone Encounter (Signed)
Please send in rx    Biest 8/2 1 mg capsule take 1 po qd disp 30 refill x 5   Please talk with patient and update the HCM   About  mammography and colonoscopy .    Stool cards if she will do this instead .

## 2012-07-22 NOTE — Progress Notes (Signed)
Quick Note:  Called and spoke with pt and pt is aware. ______ 

## 2012-08-04 ENCOUNTER — Other Ambulatory Visit (INDEPENDENT_AMBULATORY_CARE_PROVIDER_SITE_OTHER): Payer: Medicare Other

## 2012-08-04 DIAGNOSIS — Z1211 Encounter for screening for malignant neoplasm of colon: Secondary | ICD-10-CM

## 2012-08-04 LAB — HEMOCCULT GUIAC POC 1CARD (OFFICE)
Card #2 Fecal Occult Blod, POC: NEGATIVE
Card #3 Fecal Occult Blood, POC: NEGATIVE

## 2012-10-01 ENCOUNTER — Ambulatory Visit (INDEPENDENT_AMBULATORY_CARE_PROVIDER_SITE_OTHER): Payer: Medicare Other | Admitting: Internal Medicine

## 2012-10-01 ENCOUNTER — Encounter: Payer: Self-pay | Admitting: Internal Medicine

## 2012-10-01 VITALS — BP 130/74 | HR 92 | Temp 97.8°F | Wt 139.0 lb

## 2012-10-01 DIAGNOSIS — M542 Cervicalgia: Secondary | ICD-10-CM

## 2012-10-01 DIAGNOSIS — Z79890 Hormone replacement therapy (postmenopausal): Secondary | ICD-10-CM

## 2012-10-01 MED ORDER — TIZANIDINE HCL 4 MG PO TABS: 4.0000 mg | ORAL_TABLET | Freq: Three times a day (TID) | ORAL | Status: DC | PRN

## 2012-10-01 NOTE — Patient Instructions (Addendum)
Antiinflammatory doses  Of medication .     For the next 7- 10 days until better  Add mild muscle relaxant in the meantime   If not responding to med we may need to getr x ray  And get a referral and try prednisone to decrease pain and swelling .  Take 2 ALEVE twice a day  An muscle relaxant .

## 2012-10-01 NOTE — Progress Notes (Signed)
Chief Complaint  Patient presents with  . Shoulder Pain    Left side.    HPI: Patient comes in today for SDA for  new problem evaluation. Onset insidious over last 4 days . Throbbing pain left neck to shoulder nad sometimes upper arm achy and continuous  No assoc cp sob numbness or weakness or fever.    Worse with  stres sometimes and when tilts head a certain way . No h x of injury  Is righ t handed . No hx of same   Taking ibuprofen and some topical not a lot of relief.  Pain 8/10  ROS: See pertinent positives and negatives per HPI. No cp sob  Selling ha neuro sx. No cough sore thriat swollen glands Add hx  Tried compounded estrogen  and didn't work as well as premarin so is paying 90 per month to stay on this  Past Medical History  Diagnosis Date  . Headache   . Postmenopausal HRT (hormone replacement therapy)   . Knee problem     Family History  Problem Relation Age of Onset  . Other Mother     died of se of patch narcotic, had artery diesease and amputation  . Other Father     collapsed lung- smoker    History   Social History  . Marital Status: Legally Separated    Spouse Name: N/A    Number of Children: N/A  . Years of Education: N/A   Social History Main Topics  . Smoking status: Former Games developer  . Smokeless tobacco: None  . Alcohol Use:   . Drug Use:   . Sexually Active:    Other Topics Concern  . 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 Calcium Carbonate-Vitamin D (CALCIUM + D PO) Take by mouth.        . ESTRADIOL PO Take by mouth. Biest 1mg  tab from [employer] [phone]      . estrogens, conjugated, (PREMARIN) 1.25 MG tablet Take 1 tablet (1.25 mg total) by mouth daily.  30 tablet  6  . ibuprofen (ADVIL,MOTRIN) 800 MG tablet 1 tid as needed for knee pain.  40 tablet   2  . vitamin B-12 (CYANOCOBALAMIN) 100 MCG tablet Take 100 mcg by mouth daily.        Marland Kitchen tiZANidine (ZANAFLEX) 4 MG tablet Take 1 tablet (4 mg total) by mouth every 8 (eight) hours as needed.  30 tablet  0   No facility-administered encounter medications on file as of 10/01/2012.    EXAM:  BP 130/74  Pulse 92  Temp(Src) 97.8 F (36.6 C) (Oral)  Wt 139 lb (63.05 kg)  BMI 24.81 kg/m2  SpO2 96%  Body mass index is 24.81 kg/(m^2).  GENERAL: vitals reviewed and listed above, alert, oriented, appears well hydrated and in no acute distress a bit uncomfortable    HEENT: atraumatic, conjunctiva  clear, no obvious abnormalities on inspection of external nose and ears OP : no lesion edema or exudate   NECK: no obvious masses on inspection palpation  No adenopathy no point tenderness   Supple pain with left rotation and ove rtrapezious   With som spasm   LUNGS: clear to auscultation bilaterally, no wheezes, rales or rhonchi, good air movement  CV:  HRRR, no clubbing cyanosis or  peripheral edema nl cap refill  Neuro seem intact  MS: moves all extremities without noticeable focal  Abnormality good rom of shoulder joint  Points to left trap and ac area   PSYCH: pleasant and cooperative, no obvious depression or anxiety  ASSESSMENT AND PLAN:  Discussed the following assessment and plan:  Neck pain on left side - radiation to shoulder  no obv cv neuro isse but is worse with stress? buyt contiuous and positional concern about radicular sx  neuritis  -Patient advised to return or notify health care team  if symptoms worsen or persist or new concerns arise.  Patient Instructions  Antiinflammatory doses  Of medication .     For the next 7- 10 days until better  Add mild muscle relaxant in the meantime   If not responding to med we may need to getr x ray  And get a referral and try prednisone to decrease pain and swelling .  Take 2 ALEVE twice a day  An muscle relaxant .    Neta Mends. Nahmir Zeidman  M.D.

## 2012-10-02 ENCOUNTER — Ambulatory Visit: Payer: Medicare Other | Admitting: Internal Medicine

## 2012-11-03 ENCOUNTER — Ambulatory Visit (INDEPENDENT_AMBULATORY_CARE_PROVIDER_SITE_OTHER): Payer: Medicare Other | Admitting: Internal Medicine

## 2012-11-03 ENCOUNTER — Encounter: Payer: Self-pay | Admitting: Internal Medicine

## 2012-11-03 VITALS — BP 122/78 | HR 92 | Temp 98.3°F

## 2012-11-03 DIAGNOSIS — L500 Allergic urticaria: Secondary | ICD-10-CM

## 2012-11-03 DIAGNOSIS — T887XXA Unspecified adverse effect of drug or medicament, initial encounter: Secondary | ICD-10-CM

## 2012-11-03 DIAGNOSIS — T50905A Adverse effect of unspecified drugs, medicaments and biological substances, initial encounter: Secondary | ICD-10-CM

## 2012-11-03 MED ORDER — METHYLPREDNISOLONE ACETATE 80 MG/ML IJ SUSP
80.0000 mg | Freq: Once | INTRAMUSCULAR | Status: AC
  Administered 2012-11-03: 80 mg via INTRAMUSCULAR

## 2012-11-03 NOTE — Addendum Note (Signed)
Addended by: Carin Primrose on: 11/03/2012 01:28 PM   Modules accepted: Orders, Medications

## 2012-11-03 NOTE — Progress Notes (Signed)
Subjective:    Patient ID: Whitney Payne, female    DOB: 06-Mar-1946, 67 y.o.   MRN: [id number]  HPI  Pt presents to the clinic today with c/o a rash. She noticed this about 1 week ago. She reports the rash as very itchy. She has been putting cortisone cream on it during the day and taking benadryl at night. She has taken flexeril prior to the rash. She has never taken flexeril before. She has not had contact with other people with similar symptoms.  Review of Systems      Past Medical History  Diagnosis Date  . Headache(784.0)   . Postmenopausal HRT (hormone replacement therapy)   . Knee problem     Current Outpatient Prescriptions  Medication Sig Dispense Refill  . Calcium Carbonate-Vitamin D (CALCIUM + D PO) Take by mouth.        . estrogens, conjugated, (PREMARIN) 1.25 MG tablet Take 1 tablet (1.25 mg total) by mouth daily.  30 tablet  6  . ibuprofen (ADVIL,MOTRIN) 800 MG tablet 1 tid as needed for knee pain.  40 tablet  2  . tiZANidine (ZANAFLEX) 4 MG tablet Take 1 tablet (4 mg total) by mouth every 8 (eight) hours as needed.  30 tablet  0  . vitamin B-12 (CYANOCOBALAMIN) 100 MCG tablet Take 100 mcg by mouth daily.         No current facility-administered medications for this visit.    No Known Allergies  Family History  Problem Relation Age of Onset  . Other Mother     died of se of patch narcotic, had artery diesease and amputation  . Other Father     collapsed lung- smoker    History   Social History  . Marital Status: Legally Separated    Spouse Name: N/A    Number of Children: N/A  . Years of Education: N/A   Occupational History  . Not on file.   Social History Main Topics  . Smoking status: Former Games developer  . Smokeless tobacco: Not on file  . Alcohol Use: Not on file  . Drug Use: Not on file  . Sexually Active: Not on file   Other Topics Concern  . 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  Skin: Pt reports rash.   No other specific complaints in a complete review of systems (except as listed in HPI above).  Objective:   Physical Exam  . BP 122/78  Pulse 92  Temp(Src) 98.3 F (36.8 C) (Oral)  SpO2 98% Wt Readings from Last 3 Encounters:  10/01/12 139 lb (63.05 kg)  07/08/12 148 lb (67.132 kg)  11/21/11 142 lb (64.411 kg)    General: Appears t her stated age, well developed, well nourished in NAD. Skin: Warm, dry and intact. Generalized macular rash noted on back, chest and extremities, resembling allergic reaction. Cardiovascular: Normal rate and rhythm. S1,S2 noted.  No murmur, rubs or gallops noted. No JVD or BLE edema. No carotid bruits noted. Pulmonary/Chest: Normal effort and positive vesicular breath sounds. No respiratory distress. No wheezes, rales or ronchi noted.        Assessment & Plan:   Allergic reaction to medication, new onset:  Discontinue Flexeril at this time 80 mg Depo IM today Continue cortisone cream and  Benadryl at night  RTC as needed or if symptoms persist or worsen

## 2012-11-03 NOTE — Patient Instructions (Signed)

## 2013-03-09 ENCOUNTER — Other Ambulatory Visit: Payer: Self-pay | Admitting: Internal Medicine

## 2013-05-13 ENCOUNTER — Telehealth: Payer: Self-pay | Admitting: Internal Medicine

## 2013-05-13 NOTE — Telephone Encounter (Signed)
Pt will see dr Jonny Ruiz tomorrow

## 2013-05-13 NOTE — Telephone Encounter (Signed)
Patient Information:  Caller Name: Satrina  Phone: [phone]  Patient: Whitney Payne  Gender: Female  DOB: [birth year]  Age: 67 Years  PCP: [facility])  Office Follow Up:  Does the office need to follow up with this patient?: Yes  Instructions For The Office: Contact patient to give further instructions.  RN Note:  Offered to schedule at another office, but patient is not available to go to office this afternoon. Please contact her with further instructions.  Symptoms  Reason For Call & Symptoms: Dizziness and vomiting. No episodes of vomiting this morning. Reports dizziness and feeling lightheaded. Gets worse with change in position. Is able to stand and walk like normal today. Feels mild soreness at times in the left shoulder blade.  Reviewed Health History In EMR: Yes  Reviewed Medications In EMR: Yes  Reviewed Allergies In EMR: Yes  Reviewed Surgeries / Procedures: Yes  Date of Onset of Symptoms: 05/12/2013  Guideline(s) Used:  Dizziness  Disposition Per Guideline:   Go to Office Now  Reason For Disposition Reached:   Lightheadedness (dizziness) present now, after 2 hours of rest and fluids  Advice Given:  N/A  Patient Will Follow Care Advice:  YES

## 2013-05-13 NOTE — Telephone Encounter (Signed)
Pt can be offered appt tomorrow with another provider as her PCP is out of the office until 05/24/13.

## 2013-05-14 ENCOUNTER — Encounter: Payer: Self-pay | Admitting: Internal Medicine

## 2013-05-14 ENCOUNTER — Ambulatory Visit (INDEPENDENT_AMBULATORY_CARE_PROVIDER_SITE_OTHER): Payer: Medicare Other | Admitting: Internal Medicine

## 2013-05-14 VITALS — BP 150/90 | HR 87 | Temp 98.3°F | Ht 63.0 in | Wt 146.5 lb

## 2013-05-14 DIAGNOSIS — R42 Dizziness and giddiness: Secondary | ICD-10-CM

## 2013-05-14 DIAGNOSIS — H6692 Otitis media, unspecified, left ear: Secondary | ICD-10-CM | POA: Insufficient documentation

## 2013-05-14 DIAGNOSIS — R079 Chest pain, unspecified: Secondary | ICD-10-CM

## 2013-05-14 DIAGNOSIS — H6690 Otitis media, unspecified, unspecified ear: Secondary | ICD-10-CM

## 2013-05-14 MED ORDER — MECLIZINE HCL 12.5 MG PO TABS: 12.5000 mg | ORAL_TABLET | Freq: Three times a day (TID) | ORAL | Status: DC | PRN

## 2013-05-14 MED ORDER — AZITHROMYCIN 250 MG PO TABS: ORAL_TABLET | ORAL | Status: DC

## 2013-05-14 NOTE — Assessment & Plan Note (Signed)
Mild to mod, for antibx course,  to f/u any worsening symptoms or concerns 

## 2013-05-14 NOTE — Assessment & Plan Note (Addendum)
ECG reviewed as per emr, c/w veritigo likely related to middle ear involvment, for meclzine prn,  to f/u any worsening symptoms or concerns

## 2013-05-14 NOTE — Progress Notes (Signed)
Pre-visit discussion using our clinic review tool. No additional management support is needed unless otherwise documented below in the visit note.  

## 2013-05-14 NOTE — Progress Notes (Signed)
   Subjective:    Patient ID: Whitney Payne, female    DOB: 1946/05/16, 67 y.o.   MRN: [id number]  HPI Here with recnet onset left ear fullness, and dizziness culminating in n/v last pm, no fever, sinus or ST symtpoms.  No cough. Dizziness worse with position change.  Also with intermittent right ear ? Wax buildup.  Also with near left shoulder recurring fleeting sharp pains intermittent for several days, not worse to move shoulder, but worse to lie on left side Past Medical History  Diagnosis Date  . Headache(784.0)   . Postmenopausal HRT (hormone replacement therapy)   . Knee problem    Past Surgical History  Procedure Laterality Date  . Abdominal hysterectomy    . Tubal ligation    . Cataract extraction      reports that she has quit smoking. She does not have any smokeless tobacco history on file. Her alcohol and drug histories are not on file. family history includes Other in her father and mother. Allergies  Allergen Reactions  . Tizanidine Itching and Rash   Current Outpatient Prescriptions on File Prior to Visit  Medication Sig Dispense Refill  . Calcium Carbonate-Vitamin D (CALCIUM + D PO) Take by mouth.        Marland Kitchen ibuprofen (ADVIL,MOTRIN) 800 MG tablet 1 tid as needed for knee pain.  40 tablet  2  . PREMARIN 1.25 MG tablet TAKE 1 TABLET (1.25 MG TOTAL) BY MOUTH DAILY.  30 tablet  4  . vitamin B-12 (CYANOCOBALAMIN) 100 MCG tablet Take 100 mcg by mouth daily.         No current facility-administered medications on file prior to visit.   Review of Systems  Constitutional: Negative for unexpected weight change, or unusual diaphoresis  HENT: Negative for tinnitus.   Eyes: Negative for photophobia and visual disturbance.  Respiratory: Negative for choking and stridor.   Gastrointestinal: Negative for vomiting and blood in stool.  Genitourinary: Negative for hematuria and decreased urine volume.  Musculoskeletal: Negative for acute joint swelling Skin: Negative for color  change and wound.  Neurological: Negative for tremors and numbness other than noted  Psychiatric/Behavioral: Negative for decreased concentration or  hyperactivity.       Objective:   Physical Exam BP 150/90  Pulse 87  Temp(Src) 98.3 F (36.8 C) (Oral)  Ht 5\' 3"  (1.6 m)  Wt 146 lb 8 oz (66.452 kg)  BMI 25.96 kg/m2  SpO2 96% VS noted,  Constitutional: Pt appears well-developed and well-nourished.  HENT: Head: NCAT.  Right Ear: External ear normal.  Left Ear: External ear normal.  Eyes: Conjunctivae and EOM are normal. Pupils are equal, round, and reactive to light.  Bilat tm's with mild to mod erythema left > right,right canal mild wax buildup,  Max sinus areas non tender.  Pharynx with mild erythema, no exudate Neck: Normal range of motion. Neck supple.  Cardiovascular: Normal rate and regular rhythm.   Pulmonary/Chest: Effort normal and breath sounds normal.  Abd:  Soft, NT, non-distended, + BS Neurological: Pt is alert. Not confused . Motor 5/5, FTN normal, gait intact Skin: Skin is warm. No erythema.  Chest wall nontender, no swelling, rash Psychiatric: Pt behavior is normal. Thought content normal.     Assessment & Plan:

## 2013-05-14 NOTE — Assessment & Plan Note (Signed)
Left upper chest/shoulder, Atypical, etiology unclear, suspect MSK, delcines ecg or other eval at this time.  to f/u any worsening symptoms or concerns

## 2013-05-14 NOTE — Patient Instructions (Signed)
Please take all new medication as prescribed - the antibiotic, and the dizziness medication if needed You can also take Delsym OTC for cough, and/or Mucinex D(or it's generic off brand) for congestion, and tylenol as needed for pain. Please continue all other medications as before, and refills have been done if requested. Please have the pharmacy call with any other refills you may need.

## 2013-06-15 ENCOUNTER — Ambulatory Visit (INDEPENDENT_AMBULATORY_CARE_PROVIDER_SITE_OTHER): Payer: Medicare Other | Admitting: Internal Medicine

## 2013-06-15 ENCOUNTER — Encounter: Payer: Self-pay | Admitting: Internal Medicine

## 2013-06-15 VITALS — BP 136/84 | Temp 98.0°F | Ht 62.5 in | Wt 142.0 lb

## 2013-06-15 DIAGNOSIS — R42 Dizziness and giddiness: Secondary | ICD-10-CM

## 2013-06-15 DIAGNOSIS — H6120 Impacted cerumen, unspecified ear: Secondary | ICD-10-CM

## 2013-06-15 NOTE — Patient Instructions (Addendum)
  cureently i dont see a middle ear infection that I cannot visualize  right ear drum Inner ear problems can cause this sx but usually resolve on its own.  But since ongoing for a month and I cannot visualize her right eardrum advise  ent evaluation

## 2013-06-15 NOTE — Progress Notes (Signed)
Chief Complaint  Patient presents with  . Follow-up    Says the vertigo is good some days and worse of other days.  Stated that Saturday was a bad day.  Nausea is not as bad.    HPI: was seen for dizziness  atpyical shoulder chest patin  in November per dr Jonny Ruiz and given steroi z pack for red ears and antivert?  Refilled the antibiotic recently because she wasn't totally better symptoms are Almost there all the time and nausea is good days and bad days  Comae on suddenly hard to get out of bed.  No vomiting no falling headache numbness weakness vision changes. Has not had this problem before no history of head trauma. Symptoms are worse when she lays down sits up or turns her head from side to side otherwise it is controlled. ROS: See pertinent positives and negatives per HPI. No sinus pain current nose congestion did have wax in her ear and was told she had an ear infection.  Past Medical History  Diagnosis Date  . Headache(784.0)   . Postmenopausal HRT (hormone replacement therapy)   . Knee problem     Family History  Problem Relation Age of Onset  . Other Mother     died of se of patch narcotic, had artery diesease and amputation  . Other Father     collapsed lung- smoker    History   Social History  . Marital Status: Legally Separated    Spouse Name: N/A    Number of Children: N/A  . Years of Education: N/A   Social History Main Topics  . Smoking status: Former Games developer  . Smokeless tobacco: None  . Alcohol Use: None  . Drug Use: None  . Sexual Activity: None   Other Topics Concern  . 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 Calcium Carbonate-Vitamin D (CALCIUM + D PO) Take by mouth.    Marland Kitchen ibuprofen (ADVIL,MOTRIN) 800 MG tablet 1 tid as needed for knee pain.  . naproxen sodium (ANAPROX) 220 MG tablet Take  220 mg by mouth at bedtime as needed.  Marland Kitchen PREMARIN 1.25 MG tablet TAKE 1 TABLET (1.25 MG TOTAL) BY MOUTH DAILY.  . vitamin B-12 (CYANOCOBALAMIN) 100 MCG tablet Take 100 mcg by mouth daily.    . [DISCONTINUED] azithromycin (ZITHROMAX Z-PAK) 250 MG tablet Use as directed  . [DISCONTINUED] meclizine (ANTIVERT) 12.5 MG tablet Take 1 tablet (12.5 mg total) by mouth 3 (three) times daily as needed for dizziness.    EXAM:  BP 136/84  Temp(Src) 98 F (36.7 C) (Oral)  Ht 5' 2.5" (1.588 m)  Wt 142 lb (64.411 kg)  BMI 25.54 kg/m2  Body mass index is 25.54 kg/(m^2).  GENERAL: vitals reviewed and listed above, alert, oriented, appears well hydrated and in no acute distress normal gait HEENT: atraumatic, conjunctiva  clear, no obvious abnormalities on inspection of external nose and ears OP : no lesion edema or exudate TMs left normal right EAC partly occluded with wax NECK: no obvious masses on inspection palpation no bruit or adenopathy LUNGS: clear to auscultation bilaterally, no wheezes, rales or rhonchi, good air movement CV: HRRR, no clubbing cyanosis or  peripheral edema nl cap refill  MS: moves all extremities without noticeable focal  abnormality Neurologic cranial nerves  III through XII appear intact hearing not checked gait within normal limits but is very careful when turning. End gaze nystagmus if lays down or sits up quickly or turns sideways. EOMs appear full. Negative Romberg negative drift actually does heel to toe fairly well it doesn't make her head quickly no obvious focal weakness PSYCH: pleasant and cooperative, no obvious depression or anxiety After flushing   Some debri out but some whitish lgiht colored  debri in ear  ASSESSMENT AND PLAN:  Discussed the following assessment and plan:  Vertigo - Suspect possBPV but ongoing nonfocal exam today debri in the ear attempted to remove today get ent opinion. - Plan: Ambulatory referral to ENT  Wax in ear right vs debri - Plan:  Ambulatory referral to ENT Caution w/driving etc  -Patient advised to return or notify health care team  if symptoms worsen or persist or new concerns arise.  Patient Instructions   cureently i dont see a middle ear infection that I cannot visualize  right ear drum Inner ear problems can cause this sx but usually resolve on its own.  But since ongoing for a month and I cannot visualize her right eardrum advise  ent evaluation        Neta MendsWanda K. Norleen Xie M.D.  Pre visit review using our clinic review tool, if applicable. No additional management support is needed unless otherwise documented below in the visit note.

## 2013-09-10 ENCOUNTER — Encounter: Payer: Self-pay | Admitting: Internal Medicine

## 2013-09-10 ENCOUNTER — Encounter: Payer: Medicare Other | Admitting: Internal Medicine

## 2013-09-10 NOTE — Progress Notes (Signed)
Document opened and reviewed for OV but appt  canceled same day .  

## 2013-09-15 ENCOUNTER — Encounter: Payer: Self-pay | Admitting: Internal Medicine

## 2013-09-15 ENCOUNTER — Ambulatory Visit (INDEPENDENT_AMBULATORY_CARE_PROVIDER_SITE_OTHER): Payer: Medicare Other | Admitting: Internal Medicine

## 2013-09-15 VITALS — BP 142/80 | Temp 97.6°F | Ht 62.5 in | Wt 142.0 lb

## 2013-09-15 DIAGNOSIS — R42 Dizziness and giddiness: Secondary | ICD-10-CM

## 2013-09-15 DIAGNOSIS — R030 Elevated blood-pressure reading, without diagnosis of hypertension: Secondary | ICD-10-CM

## 2013-09-15 NOTE — Patient Instructions (Signed)
This still sounds like positional dizziness . To me  Will be contacted about  neurology consult.   ? If vestibular rehabilitation will help.

## 2013-09-15 NOTE — Progress Notes (Signed)
Chief Complaint  Patient presents with  . Follow-up    Dizziness is sometimes worse.  Tizanadine gave her an upset stomach.    HPI: Pt comesin today for fu vertigo seen in January and evalutaed  Seen by ent  Dr byers and evaluation rx disc said it wasn't totally typical BPV because she described lightheadedness and discussed MRI physical therapy and followup. At some point she was given Tigan edema which made her feel bad so she stopped it.   She has continued intermittent vertigo-type symptoms when she rolls to the right in bed sits up or looks down or looks up on a shelf she's had no falling sometimes feels unsteady. No loss of consciousness numbness or focal weakness. Episodes last minutes or longer. It is difficult sometimes for her to drive which is to turn her head to back up. Continues when gets out of bed and hold head   And bend over an lifts.  ROS: See pertinent positives and negatives per HPI. Negative chest pain shortness of breath. To be alert for her [facility].  Pressure has been okay not exceptionally high in the 07/03/1938 range. He takes an occasional Aleve but not very frequently so not related to her vertigo.  Past Medical History  Diagnosis Date  . Headache(784.0)   . Postmenopausal HRT (hormone replacement therapy)   . Knee problem     Family History  Problem Relation Age of Onset  . Other Mother     died of se of patch narcotic, had artery diesease and amputation  . Other Father     collapsed lung- smoker    History   Social History  . Marital Status: Legally Separated    Spouse Name: N/A    Number of Children: N/A  . Years of Education: N/A   Social History Main Topics  . Smoking status: Former Games developermoker  . Smokeless tobacco: None  . Alcohol Use: None  . Drug Use: None  . Sexual Activity: None   Other Topics Concern  . 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 Calcium Carbonate-Vitamin D (CALCIUM + D PO) Take by mouth.    Marland Kitchen. ibuprofen (ADVIL,MOTRIN) 800 MG tablet 1 tid as needed for knee pain.  . naproxen sodium (ANAPROX) 220 MG tablet Take 220 mg by mouth at bedtime as needed.  Marland Kitchen. PREMARIN 1.25 MG tablet TAKE 1 TABLET (1.25 MG TOTAL) BY MOUTH DAILY.  . vitamin B-12 (CYANOCOBALAMIN) 100 MCG tablet Take 100 mcg by mouth daily.      EXAM:  BP 142/80  Temp(Src) 97.6 F (36.4 C) (Oral)  Ht 5' 2.5" (1.588 m)  Wt 142 lb (64.411 kg)  BMI 25.54 kg/m2  Body mass index is 25.54 kg/(m^2).  GENERAL: vitals reviewed and listed above, alert, oriented, appears well hydrated and in no acute distress well groomed looks well HEENT: atraumatic, conjunctiva  clear, no obvious abnormalities on inspection of external nose and ears OP : no lesion edema or exudate tongue is midline NECK: no obvious masses on inspection palpation no bruits are heard LUNGS: clear to auscultation bilaterally, no wheezes, rales or rhonchi, good air movement CV: HRRR, no clubbing cyanosis or  peripheral edema nl cap refill  MS: moves all extremities without noticeable focal  abnormality PSYCH: pleasant  and cooperative, no obvious depression or anxiety Gait seems to be within normal limits except when she turns she has to steady herself and rate Jost. There is no tremor becomes are worse when she turns her head sits up or looks down. Cannot really do heel to toe but Romberg is negative.  ASSESSMENT AND PLAN:  Discussed the following assessment and plan:  Vertigo - Plan: Ambulatory referral to Neurology  Elevated blood pressure reading This still could be benign positional vertigo but is persistent over months and Dr. Jearld FentonByers did not think it was slightly atypical for that presentation discussed options such as neuro rehabilitation physical therapy but because of questionable diagnosis  will refer her to neurology for opinion and plan for intervention. At this point no medication given. -Patient advised to return or notify health care team  if symptoms worsen ,persist or new concerns arise.  Patient Instructions  This still sounds like positional dizziness . To me  Will be contacted about  neurology consult.   ? If vestibular rehabilitation will help.    Neta MendsWanda K. Aleeta Schmaltz M.D.  Pre visit review using our clinic review tool, if applicable. No additional management support is needed unless otherwise documented below in the visit note.

## 2013-10-13 ENCOUNTER — Encounter: Payer: Self-pay | Admitting: Neurology

## 2013-10-13 ENCOUNTER — Ambulatory Visit (INDEPENDENT_AMBULATORY_CARE_PROVIDER_SITE_OTHER): Payer: Medicare Other | Admitting: Neurology

## 2013-10-13 VITALS — BP 118/80 | HR 100 | Resp 18 | Ht 63.0 in | Wt 141.0 lb

## 2013-10-13 DIAGNOSIS — H8110 Benign paroxysmal vertigo, unspecified ear: Secondary | ICD-10-CM

## 2013-10-13 LAB — CREATININE, SERUM: Creat: 0.73 mg/dL (ref 0.50–1.10)

## 2013-10-13 NOTE — Patient Instructions (Addendum)
1.  We will get MRI of the brain.   [address]  [facility]  10/16/13 at 4:15 2.  Review the home exercises 3.  If MRI okay, consider vestibular rehab 4.  Follow up in 2 months.

## 2013-10-13 NOTE — Progress Notes (Signed)
NEUROLOGY CONSULTATION NOTE  Ulyess BlossomJosephine Such MRN: [id number] DOB: [birth year]  Referring provider: Dr. Fabian SharpPanosh Primary care provider: Dr. Fabian SharpPanosh  Reason for consult:  Dizziness  HISTORY OF PRESENT ILLNESS: Whitney Payne is a 68 year old right-handed woman with history of headache and postmenopausal hormone replacement therapy who presents for vertigo.  Records and images were personally reviewed where available.    Symptoms started approximately 6-7 months ago.  When she either moves her head, mostly to the left, or standing up, or looking up to reach for things, she developed sudden onset of spinning sensation lasting just a few seconds. This is sometimes associated with nausea. She also will feel a little wobbly on her feet. There is no associated lightheadedness, headaches, neck pain, visual symptoms, ringing in the ears, oral fullness, focal numbness or weakness of the face or body, or dysphagia. She has not had any falls related to this. It usually occurs daily. She was initially prescribed tizanidine which made her feel ill. She currently takes dymenhydrinate for severe attacks, which helps.  She also looked up some vestibular exercises on the internet which also helps.  It has not changed over the past several months, some days better than others.  She [facility].  Testing revealed slight high-frequency hearing loss bilaterally, but no significant asymmetry.  He felt that the because of her sensation of lightheadedness, it is less likely to be an inner ear problem, however she tells me that there is no associated lightheadedness (purely spinning sensation).  PAST MEDICAL HISTORY: Past Medical History  Diagnosis Date  . Headache(784.0)   . Postmenopausal HRT (hormone replacement therapy)   . Knee problem     PAST SURGICAL HISTORY: Past Surgical History  Procedure Laterality Date  . Abdominal hysterectomy    . Tubal  ligation    . Cataract extraction      MEDICATIONS: Current Outpatient Prescriptions on File Prior to Visit  Medication Sig Dispense Refill  . Calcium Carbonate-Vitamin D (CALCIUM + D PO) Take by mouth.        Marland Kitchen. ibuprofen (ADVIL,MOTRIN) 800 MG tablet 1 tid as needed for knee pain.  40 tablet  2  . naproxen sodium (ANAPROX) 220 MG tablet Take 220 mg by mouth at bedtime as needed.      Marland Kitchen. PREMARIN 1.25 MG tablet TAKE 1 TABLET (1.25 MG TOTAL) BY MOUTH DAILY.  30 tablet  4  . vitamin B-12 (CYANOCOBALAMIN) 100 MCG tablet Take 100 mcg by mouth daily.         No current facility-administered medications on file prior to visit.    ALLERGIES: Allergies  Allergen Reactions  . Tizanidine Itching and Rash    Feels sick     FAMILY HISTORY: Family History  Problem Relation Age of Onset  . Other Mother     died of se of patch narcotic, had artery diesease and amputation  . Other Father     collapsed lung- smoker    SOCIAL HISTORY: History   Social History  . Marital Status: Legally Separated    Spouse Name: N/A    Number of Children: N/A  . Years of Education: N/A   Occupational History  . Not on file.   Social History Main Topics  . Smoking status: Former Smoker    Quit date: 10/14/1963  . Smokeless tobacco: Not on file  . Alcohol Use: Yes     Comment: occasionally  .  Drug Use: No  . Sexual Activity: Not on file   Other Topics Concern  . 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   Exercising ok   Neg tad     REVIEW OF SYSTEMS: Constitutional: No fevers, chills, or sweats, no generalized fatigue, change in appetite Eyes: No visual changes, double vision, eye pain Ear, nose and throat: No hearing loss, ear pain, nasal congestion, sore throat Cardiovascular: No chest pain, palpitations Respiratory:  No shortness of breath at rest or with exertion, wheezes GastrointestinaI: No nausea, vomiting, diarrhea,  abdominal pain, fecal incontinence Genitourinary:  No dysuria, urinary retention or frequency Musculoskeletal:  No neck pain, back pain Integumentary: No rash, pruritus, skin lesions Neurological: as above Psychiatric: No depression, insomnia, anxiety Endocrine: No palpitations, fatigue, diaphoresis, mood swings, change in appetite, change in weight, increased thirst Hematologic/Lymphatic:  No anemia, purpura, petechiae. Allergic/Immunologic: no itchy/runny eyes, nasal congestion, recent allergic reactions, rashes  PHYSICAL EXAM: Filed Vitals:   10/13/13 1052  BP: 118/80  Pulse: 100  Resp: 18   General: No acute distress Head:  Normocephalic/atraumatic Neck: supple, no paraspinal tenderness, full range of motion Back: No paraspinal tenderness Heart: regular rate and rhythm Lungs: Clear to auscultation bilaterally. Vascular: No carotid bruits. Neurological Exam: Mental status: alert and oriented to person, place, and time, recent and remote memory intact, fund of knowledge intact, attention and concentration intact, speech fluent and not dysarthric, language intact. Cranial nerves: CN I: not tested CN II: pupils equal, round and reactive to light, visual fields intact, fundi unremarkable, without vessel changes, exudates, hemorrhages or papilledema. CN III, IV, VI:  full range of motion, no nystagmus, no ptosis CN V: facial sensation intact CN VII: upper and lower face symmetric CN VIII: hearing intact CN IX, X: gag intact, uvula midline CN XI: sternocleidomastoid and trapezius muscles intact CN XII: tongue midline Bulk & Tone: normal, no fasciculations. Motor: 5 out of 5 throughout Sensation: Temperature and vibration intact Deep Tendon Reflexes: 2+ throughout except absent in the ankles, toes downgoing Finger to nose testing: No dysmetria Heel to shin: No dysmetria Gait: Normal station and stride. A little unsteady when she turned around as she became dizzy. Able to walk in  tandem. Romberg negative. Dix-Hallpike negative  IMPRESSION: Benign paroxysmal positional vertigo.  Despite the negative Dix-Hallpike maneuver, it really seems like this.  No focal abnormalities on exam.  PLAN: 1.  Since she has no history of vertigo and this has been persistent for at least 6 months, we will get MRI of the brain with and without contrast. 2.  Provided a sheet specifically on the Epley maneuver to try at home. 3.  She will follow up in 2 months.  If MRI unremarkable and if symptoms persist, will consider vestibular rehab. 4.  Advised to limit use of motion sickness medication.  45 minutes spent with patient, over 50% spent counseling and coordinating care.  Thank you for allowing me to take part in the care of this patient.  Shon MilletAdam Genova Kiner, DO  CC:  Berniece AndreasWanda Panosh, MD

## 2013-10-16 ENCOUNTER — Inpatient Hospital Stay: Admission: RE | Admit: 2013-10-16 | Payer: Medicare Other | Source: Ambulatory Visit

## 2013-10-23 ENCOUNTER — Ambulatory Visit
Admission: RE | Admit: 2013-10-23 | Discharge: 2013-10-23 | Disposition: A | Discharge status: Home / Self Care | Payer: Medicare Other | Source: Ambulatory Visit | Attending: Neurology | Admitting: Neurology

## 2013-10-23 DIAGNOSIS — H8110 Benign paroxysmal vertigo, unspecified ear: Secondary | ICD-10-CM

## 2013-10-23 MED ORDER — GADOBENATE DIMEGLUMINE 529 MG/ML IV SOLN
13.0000 mL | Freq: Once | INTRAVENOUS | Status: AC | PRN
  Administered 2013-10-23: 13 mL via INTRAVENOUS

## 2013-10-26 ENCOUNTER — Telehealth: Payer: Self-pay | Admitting: Internal Medicine

## 2013-10-26 ENCOUNTER — Telehealth: Payer: Self-pay | Admitting: *Deleted

## 2013-10-26 ENCOUNTER — Telehealth: Payer: Self-pay | Admitting: Neurology

## 2013-10-26 NOTE — Telephone Encounter (Signed)
Pt states that her mri showed a lymph note in the neck area, and wanted to know if dr Fabian Sharp can call her in an antibiotic, states everything else was normal. [facility] [address].

## 2013-10-26 NOTE — Telephone Encounter (Signed)
Message copied by Fredirick Maudlin on Tue Oct 26, 2013  9:48 AM ------      Message from: JAFFE, ADAM R      Created: Tue Oct 26, 2013  7:48 AM       MRI brain looks okay.  Incidentally, there looks to be mildly enlarged lymph nodes seen in the neck.  This is likely not clinically relevant but if she has been feeling ill, she may want to discuss with her PCP regarding if further testing is warranted.      ----- Message -----         From: Rad Results In Interface         Sent: 10/23/2013  10:44 AM           To: Cira Servant, DO                   ------

## 2013-10-26 NOTE — Telephone Encounter (Signed)
After review of report :  Ov to discuss and examine the areas  .to decide  Next step.

## 2013-10-26 NOTE — Telephone Encounter (Signed)
Pt called requesting to speak to a nurse. She has questions regarding her results.

## 2013-10-26 NOTE — Telephone Encounter (Signed)
Patient is aware of MRI as well as enlarged lymph node in the neck

## 2013-10-27 NOTE — Telephone Encounter (Signed)
Patient has made a future appt for 11/05/13 @ 10:30 to discuss results.

## 2013-11-05 ENCOUNTER — Ambulatory Visit (INDEPENDENT_AMBULATORY_CARE_PROVIDER_SITE_OTHER): Payer: Medicare Other | Admitting: Internal Medicine

## 2013-11-05 ENCOUNTER — Encounter: Payer: Self-pay | Admitting: Internal Medicine

## 2013-11-05 VITALS — BP 126/84 | Temp 97.9°F | Ht 62.5 in | Wt 141.0 lb

## 2013-11-05 DIAGNOSIS — R599 Enlarged lymph nodes, unspecified: Secondary | ICD-10-CM

## 2013-11-05 DIAGNOSIS — R42 Dizziness and giddiness: Secondary | ICD-10-CM

## 2013-11-05 DIAGNOSIS — M25569 Pain in unspecified knee: Secondary | ICD-10-CM | POA: Insufficient documentation

## 2013-11-05 MED ORDER — IBUPROFEN 800 MG PO TABS: ORAL_TABLET | ORAL | Status: DC

## 2013-11-05 NOTE — Patient Instructions (Addendum)
Your exam is reassuring . i dont feel abnornal lymph glands on your exam However I will talk to radiologist to decide if further scanning is   Important. And let you know .   Ok to refill ibuprofen.  For now.

## 2013-11-05 NOTE — Progress Notes (Signed)
Chief Complaint  Patient presents with  . Follow-up    HPI: Comes in for fu of imaging study done by neuro for eval of dizziness and vertigo with showed no sig brain disease but radiologist noted some issue with needing better look at the LN in her neck  . ? If needs fu and concerns about this  ROS: See pertinent positives and negatives per HPI. No fever swollen glands current infection sx   Gets hive type reaction with pressure of purse or heavy objects that resolved in minutes with HCS? No systemic hives allergy angioedema. taks ocass ibu fir knee pain  Would like refill   Past Medical History  Diagnosis Date  . Headache(784.0)   . Postmenopausal HRT (hormone replacement therapy)   . Knee problem     Family History  Problem Relation Age of Onset  . Other Mother     died of se of patch narcotic, had artery diesease and amputation  . Other Father     collapsed lung- smoker    History   Social History  . Marital Status: Legally Separated    Spouse Name: N/A    Number of Children: N/A  . Years of Education: N/A   Social History Main Topics  . Smoking status: Former Smoker    Quit date: 10/14/1963  . Smokeless tobacco: None  . Alcohol Use: Yes     Comment: occasionally  . Drug Use: No  . Sexual Activity: None   Other Topics Concern  . 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 Calcium Carbonate-Vitamin D (CALCIUM + D PO) Take by mouth.    . DimenhyDRINATE (MOTION SICKNESS PO) Take by mouth as needed.  Marland Kitchen ibuprofen (ADVIL,MOTRIN) 800 MG tablet 1 tid as needed for knee pain.  Marland Kitchen PREMARIN 1.25 MG tablet TAKE 1 TABLET (1.25 MG TOTAL) BY MOUTH DAILY.  . vitamin B-12 (CYANOCOBALAMIN) 100 MCG tablet Take 100 mcg by mouth daily.    . [DISCONTINUED] ibuprofen (ADVIL,MOTRIN) 800 MG tablet 1 tid as needed for knee  pain.  . [DISCONTINUED] naproxen sodium (ANAPROX) 220 MG tablet Take 220 mg by mouth at bedtime as needed.    EXAM:  BP 126/84  Temp(Src) 97.9 F (36.6 C) (Oral)  Ht 5' 2.5" (1.588 m)  Wt 141 lb (63.957 kg)  BMI 25.36 kg/m2  Body mass index is 25.36 kg/(m^2).  GENERAL: vitals reviewed and listed above, alert, oriented, appears well hydrated and in no acute distress looks well  Dizzy when up from table but is well.  HEENT: atraumatic, conjunctiva  clear, no obvious abnormalities on inspection of external nose and ears OP : no lesion edema or exudate  NECK: no obvious masses on inspection palpation   Shoddy tiny ln single pc area  No sig adenopathy  LN: no cervical axillary inguinal adenopathy except shoddy pc area . No Kenefic node  Abdomen:  Sof,t normal bowel sounds without hepatosplenomegaly, no guarding rebound or masses no CVA tenderness CV: HRRR, no g  m  no clubbing cyanosis or  peripheral edema nl cap refill  MS: moves all extremities without noticeable focal  abnormality PSYCH: pleasant and cooperative, no obvious depression or anxiety BP Readings from Last 3 Encounters:  11/05/13 126/84  10/13/13 118/80  09/15/13 142/80  ASSESSMENT AND PLAN:  Discussed the following assessment and plan:  Dizziness and giddiness - neg eval so fare related to movement seems stable  fu with neurology.  Lymph node enlargement ?  on mri scan of head incidental finding  - 6 mm.  physical exam is normal  will discuss with radiology about ? need for ct neck  and follow clinically   Knee pain - caution iwht use ibu ofk prn. Declines immunized preventive  Parameters not done  Radiology concern about cervical LN  Advised ct of neck  fortunately her exam is normal and she doesn't have any symptoms of lymph node disease or unusual infection. Will reassure patient follow on an as needed physical exam basis but speak with the radiologist about if there is a compelling interest to rescan her with a CT of  the neck. -Patient advised to return or notify health care team  if symptoms worsen ,persist or new concerns arise.  Patient Instructions  Your exam is reassuring . i dont feel abnornal lymph glands on your exam However I will talk to radiologist to decide if further scanning is   Important. And let you know .   Ok to refill ibuprofen.  For now.    Neta MendsWanda K. Panosh M.D. Total visit 25mins > 50% spent counseling and coordinating care CT reviewed with patient. She should followup with neurology as planned.     Pre visit review using our clinic review tool, if applicable. No additional management support is needed unless otherwise documented below in the visit note.

## 2013-12-13 ENCOUNTER — Ambulatory Visit: Payer: Medicare Other | Admitting: Neurology

## 2013-12-14 ENCOUNTER — Telehealth: Payer: Self-pay | Admitting: Neurology

## 2013-12-14 NOTE — Telephone Encounter (Signed)
Pt called to cancel her 12/13/13 appt. Pt stated she will call later to r/s.

## 2014-04-04 ENCOUNTER — Encounter: Payer: Self-pay | Admitting: Internal Medicine

## 2014-04-15 ENCOUNTER — Telehealth: Payer: Self-pay | Admitting: Internal Medicine

## 2014-04-21 ENCOUNTER — Telehealth: Payer: Self-pay | Admitting: Internal Medicine

## 2014-04-21 MED ORDER — ESTROGENS CONJUGATED 1.25 MG PO TABS: ORAL_TABLET | ORAL | Status: DC

## 2014-04-21 NOTE — Telephone Encounter (Signed)
Pt needs refill on premarin 1.25mg  # 30 w/refills sent to [facility] [address] in [place]. Pt is out

## 2014-04-21 NOTE — Telephone Encounter (Signed)
Sent in   have patitent make OV   Before runs out of refills  .

## 2014-04-22 NOTE — Telephone Encounter (Signed)
Pt has been sch

## 2014-04-25 ENCOUNTER — Telehealth: Payer: Self-pay | Admitting: Internal Medicine

## 2014-04-25 NOTE — Telephone Encounter (Signed)
PA request for Premarin was received due to medication not being covered by insurance.  Do you still want me to proceed with PA request or does the patient pay out of pocket for medication??  Alendronate, Citalopram, and Fluoxetine are preferred.

## 2014-04-25 NOTE — Telephone Encounter (Signed)
PA has been submitted.

## 2014-04-25 NOTE — Telephone Encounter (Signed)
Spoke to the pt.  She pays out of pocket for this medication.  However, she would like to go ahead and proceed with PA to see if her insurance company will pay for it. Thanks!

## 2014-04-25 NOTE — Telephone Encounter (Signed)
I think she pays out of pocket cause wont be approved  Call her about this and see what she says  I dont think it will be approved otherwise.

## 2014-04-26 NOTE — Telephone Encounter (Signed)
PA was denied.  Patient must try and fail all of the following: Actonel or Atelvia Binosto or alendronate Evista Femring Ibandronate Citalopram Fluoxetine Gabapentin Paxil Venlafaxine

## 2014-04-26 NOTE — Telephone Encounter (Signed)
Pt notified.  She will continue to pay out of pocket for the medication.

## 2014-06-21 ENCOUNTER — Ambulatory Visit: Payer: Medicare Other | Admitting: Internal Medicine

## 2014-08-17 ENCOUNTER — Other Ambulatory Visit: Payer: Self-pay | Admitting: Internal Medicine

## 2014-09-03 ENCOUNTER — Encounter: Payer: Self-pay | Admitting: Family Medicine

## 2014-09-03 ENCOUNTER — Ambulatory Visit (INDEPENDENT_AMBULATORY_CARE_PROVIDER_SITE_OTHER): Payer: Medicare Other | Admitting: Family Medicine

## 2014-09-03 VITALS — BP 120/80 | HR 98 | Temp 98.1°F | Ht 62.5 in | Wt 144.0 lb

## 2014-09-03 DIAGNOSIS — M62838 Other muscle spasm: Secondary | ICD-10-CM | POA: Diagnosis not present

## 2014-09-03 DIAGNOSIS — S46912A Strain of unspecified muscle, fascia and tendon at shoulder and upper arm level, left arm, initial encounter: Secondary | ICD-10-CM | POA: Diagnosis not present

## 2014-09-03 MED ORDER — CYCLOBENZAPRINE HCL 5 MG PO TABS: 5.0000 mg | ORAL_TABLET | Freq: Three times a day (TID) | ORAL | Status: DC | PRN

## 2014-09-03 MED ORDER — MELOXICAM 15 MG PO TABS: ORAL_TABLET | ORAL | Status: DC

## 2014-09-03 NOTE — Progress Notes (Signed)
Pre visit review using our clinic review tool, if applicable. No additional management support is needed unless otherwise documented below in the visit note. 

## 2014-09-03 NOTE — Progress Notes (Signed)
   Subjective:    Patient ID: Whitney Payne, female    DOB: 1945/06/27, 69 y.o.   MRN: [id number]  HPI    Review of Systems     Objective:   Physical Exam        Assessment & Plan:   Subjective:    Whitney Payne is a 69 y.o. female who presents with left shoulder pain and r thigh pain. The symptoms began 3 days ago. 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 Pain is located between the neck and shoulder. Discomfort is described as aching. Symptoms are exacerbated by overhead movements. Evaluation to date: none. Therapy to date includes: OTC analgesics which are somewhat effective.  The following portions of the patient's history were reviewed and updated as appropriate:   She  has a past medical history of Headache(784.0); Postmenopausal HRT (hormone replacement therapy); and Knee problem. She  does not have any pertinent problems on file. She  has past surgical history that includes Abdominal hysterectomy; Tubal ligation; and Cataract extraction. Her family history includes Other in her father and mother. She  reports that she quit smoking about 50 years ago. She does not have any smokeless tobacco history on file. She reports that she drinks alcohol. She reports that she does not use illicit drugs. She has a current medication list which includes the following prescription(s): calcium carbonate-vitamin d, dimenhydrinate, estrogens (conjugated), ibuprofen, and vitamin b-12. Current Outpatient Prescriptions on File Prior to Visit  Medication Sig Dispense Refill  . Calcium Carbonate-Vitamin D (CALCIUM + D PO) Take by mouth.      . DimenhyDRINATE (MOTION SICKNESS PO) Take by mouth as needed.    Marland Kitchen. estrogens, conjugated, (PREMARIN) 1.25 MG tablet TAKE 1 TABLET (1.25 MG TOTAL) BY MOUTH DAILY. 30 tablet 4  . ibuprofen (ADVIL,MOTRIN) 800 MG tablet TAKE 1 TABLET THREE TIMES A DAY 40 tablet 0  . vitamin B-12 (CYANOCOBALAMIN) 100 MCG tablet  Take 100 mcg by mouth daily.       No current facility-administered medications on file prior to visit.   She is allergic to tizanidine..  Review of Systems Pertinent items are noted in HPI.   Objective:    BP 120/80 mmHg  Pulse 98  Temp(Src) 98.1 F (36.7 C) (Oral)  Ht 5' 2.5" (1.588 m)  Wt 144 lb (65.318 kg)  BMI 25.90 kg/m2  SpO2 95% Right shoulder: normal active ROM, no tenderness, no impingement sign  Left shoulder: muscle spasm L trap with dec rom shoulder   Low ext-- + straight leg raise R leg--pain in lat thigh with movement and weight bearing only            No weakness in low ext,  DTR = and bl   Assessment:    Left shoulder strain  and R thigh strain  Plan:    Reduction in offending activity. Gentle ROM exercises. Rest, ice, compression, and elevation (RICE) therapy. NSAIDs per medication orders. moist heat, mobic and muscle relaxers   F/u 7-10 days if no better or if symptoms worsen

## 2014-09-03 NOTE — Patient Instructions (Signed)

## 2014-10-04 ENCOUNTER — Other Ambulatory Visit: Payer: Self-pay | Admitting: Family Medicine

## 2014-10-06 NOTE — Telephone Encounter (Signed)
Ok x 1

## 2014-10-06 NOTE — Telephone Encounter (Signed)
Sent to the pharmacy by e-scribe. 

## 2014-12-16 DIAGNOSIS — H04123 Dry eye syndrome of bilateral lacrimal glands: Secondary | ICD-10-CM | POA: Diagnosis not present

## 2015-01-16 ENCOUNTER — Other Ambulatory Visit: Payer: Self-pay | Admitting: Internal Medicine

## 2015-01-16 NOTE — Telephone Encounter (Signed)
Last seen by me 6 15 Needs OV Can refill #30  No  Further refills  Until  OV.

## 2015-01-18 ENCOUNTER — Telehealth: Payer: Self-pay | Admitting: Family Medicine

## 2015-01-18 NOTE — Telephone Encounter (Signed)
Pt will call back to sch.

## 2015-01-18 NOTE — Telephone Encounter (Signed)
Pt needs medication follow up. Please have her schedule an appt in the next 30 days.  Ibuprofen sent to the pharmacy for 30 days.

## 2015-01-18 NOTE — Telephone Encounter (Signed)
Sent to the pharmacy by e-scribe.  Message sent to scheduling to help her make an appt. 

## 2015-04-07 ENCOUNTER — Telehealth: Payer: Self-pay | Admitting: Internal Medicine

## 2015-04-07 NOTE — Telephone Encounter (Signed)
Pt decline triage and just request to make an appt for 04-14-15. Pt was advise if she has increased sob,chest pain ,significant fever to seek emergent care

## 2015-04-07 NOTE — Telephone Encounter (Signed)
Okay to make 30 minute appt on 04/14/15 but if she has increased sob, chest pain, significant fever then she should seek emergent care.  Please send pt back to triage to discuss sob.  Need to decide if needs to go to ER.

## 2015-04-07 NOTE — Telephone Encounter (Signed)
This call was transferred to [facility] and route back to me. This pt has sob and fatigue. Pt said it only happen once and can not come in until 04-14-15. Can I sch. Pt is having other issues.

## 2015-04-14 ENCOUNTER — Ambulatory Visit (INDEPENDENT_AMBULATORY_CARE_PROVIDER_SITE_OTHER): Payer: Medicare Other | Admitting: Internal Medicine

## 2015-04-14 ENCOUNTER — Encounter: Payer: Self-pay | Admitting: Internal Medicine

## 2015-04-14 VITALS — BP 136/86 | Temp 97.9°F | Ht 62.0 in | Wt 142.6 lb

## 2015-04-14 DIAGNOSIS — R52 Pain, unspecified: Secondary | ICD-10-CM

## 2015-04-14 DIAGNOSIS — R5383 Other fatigue: Secondary | ICD-10-CM | POA: Diagnosis not present

## 2015-04-14 DIAGNOSIS — R9431 Abnormal electrocardiogram [ECG] [EKG]: Secondary | ICD-10-CM | POA: Diagnosis not present

## 2015-04-14 DIAGNOSIS — R531 Weakness: Secondary | ICD-10-CM | POA: Diagnosis not present

## 2015-04-14 LAB — HEPATIC FUNCTION PANEL
ALBUMIN: 4.1 g/dL (ref 3.5–5.2)
ALK PHOS: 67 U/L (ref 39–117)
ALT: 13 U/L (ref 0–35)
AST: 15 U/L (ref 0–37)
Bilirubin, Direct: 0.1 mg/dL (ref 0.0–0.3)
TOTAL PROTEIN: 7.4 g/dL (ref 6.0–8.3)
Total Bilirubin: 0.3 mg/dL (ref 0.2–1.2)

## 2015-04-14 LAB — CBC WITH DIFFERENTIAL/PLATELET
BASOS ABS: 0 10*3/uL (ref 0.0–0.1)
Basophils Relative: 0.6 % (ref 0.0–3.0)
Eosinophils Absolute: 0.1 10*3/uL (ref 0.0–0.7)
Eosinophils Relative: 2 % (ref 0.0–5.0)
HCT: 36.7 % (ref 36.0–46.0)
Hemoglobin: 12.3 g/dL (ref 12.0–15.0)
LYMPHS ABS: 2.3 10*3/uL (ref 0.7–4.0)
Lymphocytes Relative: 49 % — ABNORMAL HIGH (ref 12.0–46.0)
MCHC: 33.6 g/dL (ref 30.0–36.0)
MCV: 91.4 fl (ref 78.0–100.0)
Monocytes Absolute: 0.5 10*3/uL (ref 0.1–1.0)
Monocytes Relative: 10.6 % (ref 3.0–12.0)
Neutro Abs: 1.8 10*3/uL (ref 1.4–7.7)
Neutrophils Relative %: 37.8 % — ABNORMAL LOW (ref 43.0–77.0)
PLATELETS: 371 10*3/uL (ref 150.0–400.0)
RBC: 4.02 Mil/uL (ref 3.87–5.11)
RDW: 14.1 % (ref 11.5–15.5)
WBC: 4.7 10*3/uL (ref 4.0–10.5)

## 2015-04-14 LAB — BASIC METABOLIC PANEL
BUN: 19 mg/dL (ref 6–23)
CALCIUM: 10 mg/dL (ref 8.4–10.5)
CO2: 29 mEq/L (ref 19–32)
Chloride: 102 mEq/L (ref 96–112)
Creatinine, Ser: 0.9 mg/dL (ref 0.40–1.20)
GFR: 79.42 mL/min (ref >60.00)
GLUCOSE: 103 mg/dL — AB (ref 70–99)
Potassium: 4.1 mEq/L (ref 3.5–5.1)
Sodium: 137 mEq/L (ref 135–145)

## 2015-04-14 LAB — TSH: TSH: 0.23 u[IU]/mL — ABNORMAL LOW (ref 0.35–4.50)

## 2015-04-14 LAB — SEDIMENTATION RATE: Sed Rate: 17 mm/hr (ref 0–22)

## 2015-04-14 LAB — T4, FREE: Free T4: 0.65 ng/dL (ref 0.60–1.60)

## 2015-04-14 NOTE — Progress Notes (Signed)
Pre visit review using our clinic review tool, if applicable. No additional management support is needed unless otherwise documented below in the visit note.  Chief Complaint  Patient presents with  . Fatigue  . Generalized Body Aches    HPI: Whitney Payne 69 y.o. comesin for  Episode last week  That scared her and had complete energy draining couldn't move walk  And had to sit down  Showered lasted  90 minutes  . Then  Felt cold .5 minutes chills  Then better   No vomiting had hot feeling  But not sweating . since  then no sob cp weak episodes  Had taken  Benadryl at 5  Pm and  Then  At  10 30 at night .  Marland Kitchen   No fever no pain .  no pain hurt sob  .   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  ROS: See pertinent positives and negatives per HPI. No syncope palpitations  Past Medical History  Diagnosis Date  . Headache(784.0)   . Postmenopausal HRT (hormone replacement therapy)   . Knee problem   . Recurrent vertigo     neuro eval see mri report    Family History  Problem Relation Age of Onset  . Other Mother     died of se of patch narcotic, had artery diesease and amputation  . Other Father     collapsed lung- smoker    Social History   Social History  . Marital Status: Legally Separated    Spouse Name: N/A  . Number of Children: N/A  . Years of Education: N/A   Social History Main Topics  . Smoking status: Former Smoker    Quit date: 10/14/1963  . Smokeless tobacco: Not on file  . Alcohol Use: Yes     Comment: occasionally  . Drug Use: No  . Sexual Activity: Not on file   Other Topics Concern  . 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 Calcium Carbonate-Vitamin D (CALCIUM + D PO) Take by mouth.      . estrogens, conjugated, (PREMARIN) 1.25 MG tablet TAKE 1 TABLET (1.25 MG TOTAL) BY MOUTH DAILY. 30 tablet 4  . ibuprofen (ADVIL,MOTRIN) 800 MG tablet Take 1 tablet (800 mg total) by mouth 3 (three) times daily as needed. 30 tablet 0  . vitamin B-12 (CYANOCOBALAMIN) 100 MCG tablet Take 100 mcg by mouth daily.      . cyclobenzaprine (FLEXERIL) 5 MG tablet Take 1 tablet (5 mg total) by mouth 3 (three) times daily as needed for muscle spasms. 30 tablet 1  . DimenhyDRINATE (MOTION SICKNESS PO) Take by mouth as needed.    . meloxicam (MOBIC) 15 MG tablet 1/2 - 1 po qd prn 30 tablet 0   No facility-administered medications prior to visit.     EXAM:  BP 136/86 mmHg  Temp(Src) 97.9 F (36.6 C) (Oral)  Ht  5\' 2"  (1.575 m)  Wt 142 lb 9.6 oz (64.683 kg)  BMI 26.08 kg/m2  Body mass index is 26.08 kg/(m^2).  GENERAL: vitals reviewed and listed above, alert, oriented, appears well hydrated and in no acute distress HEENT: atraumatic, conjunctiva  clear, no obvious abnormalities on inspection of external nose and earstmx clear OP : no lesion edema or exudate  NECK: no obvious masses on inspection palpation  Thyroid palpable? No adenopathy  LUNGS: clear to auscultation bilaterally, no wheezes, rales or rhonchi, good air movement CV: HRRR, no clubbing cyanosis or  peripheral edema nl cap refill  Abdomen:  Sof,t normal bowel sounds without hepatosplenomegaly, no guarding rebound or masses no CVA tenderness MS: moves all extremities  lef knee a bit swollen PSYCH: pleasant and cooperative, no obvious depression or anxiety  ekg SR dec voltage precordial ns t wave  q in v3   ASSESSMENT AND PLAN:  Discussed the following assessment and plan:  Episode of generalized weakness - coulg be from medicine but r/o  metabolic cv arrythmia etc .  exam reassuring today  - Plan: Basic metabolic panel, CBC with Differential/Platelet, Hepatic function panel, TSH, T4, free, Sedimentation rate, EKG 12-Lead, Ambulatory referral to Cardiology  Other fatigue - Plan: Basic metabolic panel, CBC with Differential/Platelet, Hepatic function panel, TSH, T4, free, Sedimentation rate  Aches - l knee arthritis  - Plan: Basic metabolic panel, CBC with Differential/Platelet, Hepatic function panel, TSH, T4, free, Sedimentation rate  Nonspecific abnormal electrocardiogram (ECG) (EKG) - see text. - Plan: Ambulatory referral to Cardiology   No comparison ekg   Uncertain cause of episode but   Was significant enouth  That it scared her  .  Refer to cards   Exam reassuring . It is very hard to get away cause she runs a business but will refer and get appt at convenience but if recurs seek emergent care    To limit the benadryl incase related  -Patient advised to return or notify health care team  if symptoms worsen ,persist or new concerns arise. Ok to use spa tub if helpful Patient Instructions  Will notify you  of labs when available. ekg has some abnormalities that may or may not be important .   Bet lets get cardiology to see you.   Episode could be from the  Benadryl  X 2   And avoid  This dosing in future .   Neta MendsWanda K. Temekia Caskey M.D.   Incompletely evaluated is bilateral level II/level V cervical adenopathy. None of the lymph nodes appear greater than 6 mm diameter, but correlate clinically for lymph node enlargement elsewhere. CT neck with contrast could be helpful in further evaluation.  Bilateral cataract extraction. No sinus disease. No nasopharyngeal masses.  Similar appearance to prior CT.  IMPRESSION: Normal for age cerebral volume. No significant white matter disease.  Special attention was directed the posterior fossa where no vestibular pathway abnormality or temporal bone inflammatory process is  evident.  Incompletely evaluated upper cervical shotty lymphadenopathy.   Electronically Signed  By: Davonna BellingJohn Curnes M.D.  On: 10/23/2013 10:42

## 2015-04-14 NOTE — Patient Instructions (Signed)
Will notify you  of labs when available. ekg has some abnormalities that may or may not be important .   Bet lets get cardiology to see you.   Episode could be from the  Benadryl  X 2   And avoid  This dosing in future .

## 2015-04-15 ENCOUNTER — Other Ambulatory Visit: Payer: Self-pay | Admitting: Internal Medicine

## 2015-04-18 ENCOUNTER — Other Ambulatory Visit: Payer: Self-pay | Admitting: Family Medicine

## 2015-04-18 DIAGNOSIS — R7989 Other specified abnormal findings of blood chemistry: Secondary | ICD-10-CM

## 2015-05-12 ENCOUNTER — Ambulatory Visit: Payer: Medicare Other | Admitting: Cardiology

## 2015-05-19 ENCOUNTER — Ambulatory Visit: Payer: Medicare Other | Admitting: Cardiovascular Disease

## 2015-05-26 ENCOUNTER — Other Ambulatory Visit (INDEPENDENT_AMBULATORY_CARE_PROVIDER_SITE_OTHER): Payer: Medicare Other

## 2015-05-26 DIAGNOSIS — R946 Abnormal results of thyroid function studies: Secondary | ICD-10-CM

## 2015-05-26 DIAGNOSIS — R7989 Other specified abnormal findings of blood chemistry: Secondary | ICD-10-CM | POA: Diagnosis not present

## 2015-05-26 LAB — T4, FREE: Free T4: 0.7 ng/dL (ref 0.60–1.60)

## 2015-05-26 LAB — T3, FREE: T3 FREE: 3.1 pg/mL (ref 2.3–4.2)

## 2015-05-26 LAB — TSH: TSH: 0.23 u[IU]/mL — AB (ref 0.35–4.50)

## 2015-05-27 LAB — THYROID ANTIBODIES: Thyroperoxidase Ab SerPl-aCnc: <1 IU/mL (ref <9)

## 2015-06-02 ENCOUNTER — Other Ambulatory Visit: Payer: Self-pay | Admitting: Internal Medicine

## 2015-06-02 NOTE — Telephone Encounter (Signed)
Ok x 2  See lab result note about thyroid test

## 2015-06-06 NOTE — Telephone Encounter (Signed)
Sent to the pharmacy by e-scribe. 

## 2015-06-09 ENCOUNTER — Ambulatory Visit (INDEPENDENT_AMBULATORY_CARE_PROVIDER_SITE_OTHER): Payer: Medicare Other | Admitting: Cardiovascular Disease

## 2015-06-09 ENCOUNTER — Encounter: Payer: Self-pay | Admitting: Cardiovascular Disease

## 2015-06-09 VITALS — BP 104/60 | HR 94 | Ht 63.0 in | Wt 141.3 lb

## 2015-06-09 DIAGNOSIS — R9431 Abnormal electrocardiogram [ECG] [EKG]: Secondary | ICD-10-CM

## 2015-06-09 NOTE — Patient Instructions (Signed)
Follow up with Dr Berry as needed.  

## 2015-06-09 NOTE — Progress Notes (Signed)
06/09/2015 Whitney Payne   03-02-1946  [id number]  Primary Physician Lorretta Harp, MD Primary Cardiologist: Runell Gess MD Roseanne Reno   HPI:  Whitney Payne is a 70 year old thin and fit appearing widowed African-American female mother of 2, grandmother and 5 grandchildren who was referred by Dr. Beola Cord for cardiovascular evaluation because of feeling of excessive fatigue after Taking Benadryl with a mildly abnormal EKG. 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 She essentially has no cardiovascular risk factors. She's never had a heart attack or stroke and denies chest pain or shortness of breath. She had an episode of profound fatigue after taking several doses of Benadryl on 04/14/15 and saw her primary care physician, Dr. Rolly Pancake. Her EKG showed subtle nonspecific ST and T-wave changes.   Current Outpatient Prescriptions  Medication Sig Dispense Refill  . Calcium Carbonate-Vitamin D (CALCIUM + D PO) Take by mouth.      Marland Kitchen ibuprofen (ADVIL,MOTRIN) 800 MG tablet TAKE 1 TABLET (800 MG TOTAL) BY MOUTH 3 (THREE) TIMES DAILY AS NEEDED. 30 tablet 1  . vitamin B-12 (CYANOCOBALAMIN) 100 MCG tablet Take 100 mcg by mouth daily.       No current facility-administered medications for this visit.    Allergies  Allergen Reactions  . Penicillins Itching and Swelling  . Tizanidine Itching and Rash    Feels sick     Social History   Social History  . Marital Status: Legally Separated    Spouse Name: N/A  . Number of Children: N/A  . Years of Education: N/A   Occupational History  . Not on file.   Social History Main Topics  . Smoking status: Former Smoker    Quit date: 10/14/1963  . Smokeless tobacco: Not on file  . Alcohol Use: Yes     Comment: occasionally  . Drug Use: No  . Sexual Activity: Not on file   Other Topics Concern  . 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  Cardiovascular: negative for chest pain, dyspnea on exertion, edema, orthopnea, palpitations, paroxysmal nocturnal dyspnea or shortness of breath Dermatological: negative for rash Respiratory: negative for cough or wheezing Urologic: negative for hematuria Abdominal: negative for nausea, vomiting, diarrhea, bright red blood per rectum, melena, or hematemesis Neurologic: negative for visual changes, syncope, or dizziness All other systems reviewed and are otherwise negative except as noted above.    Blood pressure 104/60, pulse 94, height 5\' 3"  (1.6 m), weight 141 lb 4.8 oz (64.093 kg).  General appearance: alert and no distress Neck: no adenopathy, no carotid bruit, no JVD, supple, symmetrical, trachea midline and thyroid not enlarged, symmetric, no tenderness/mass/nodules Lungs: clear to auscultation bilaterally Heart: regular rate and rhythm, S1, S2 normal, no murmur, click, rub or gallop Extremities: extremities normal, atraumatic, no cyanosis or edema  EKG nnot performed today, EKG performed 04/14/15 showed sinus rhythm at 74 with nonspecific ST and T-wave changes. I personally reviewed that EKG  ASSESSMENT AND PLAN:   Abnormal EKG Whitney Payne was referred by Dr. Lebron Quam because of an episode of fatigue and weakness after taking Benadryl. She had a mildly abnormal EKG with nonspecific ST and T-wave changes. She has no cardiac risk factors. She denies chest pain or shortness of  breath. I do not think she has any cardiovascular issues at this time and requires no further workup.      Runell GessJonathan J. Taje Tondreau MD FACP,FACC,FAHA, [facility] 06/09/2015 3:50 PM

## 2015-06-09 NOTE — Assessment & Plan Note (Signed)
Mrs. Whitney Payne was referred by Dr. Lebron QuamPanish because of an episode of fatigue and weakness after taking Benadryl. She had a mildly abnormal EKG with nonspecific ST and T-wave changes. She has no cardiac risk factors. She denies chest pain or shortness of breath. I do not think she has any cardiovascular issues at this time and requires no further workup.

## 2015-06-12 ENCOUNTER — Telehealth: Payer: Self-pay | Admitting: Family Medicine

## 2015-06-12 NOTE — Telephone Encounter (Signed)
-----   Message from Madelin HeadingsWanda K Panosh, MD sent at 06/09/2015  4:59 PM EST ----- Please make this a phone message so it gets in the record  Would have her repeat  tsh and free t4 in another 2-3 months .  Or if feeling badly   WP ----- Message -----    From: Nils FlackMisty T Kavitha Lansdale, CMA    Sent: 06/08/2015   2:46 PM      To: Madelin HeadingsWanda K Panosh, MD  Pt declined referral to endo at this time.  Do you want to treat her?

## 2015-06-13 ENCOUNTER — Other Ambulatory Visit: Payer: Self-pay | Admitting: Family Medicine

## 2015-06-13 DIAGNOSIS — R7989 Other specified abnormal findings of blood chemistry: Secondary | ICD-10-CM

## 2015-06-13 NOTE — Telephone Encounter (Addendum)
Pt does not want to sch and appt because she does not know what see will be doing in 2-3 month

## 2015-06-13 NOTE — Telephone Encounter (Signed)
Orders placed for lab work.  Please help the pt to make appointment in 2-3 months.  Call back if start to feel bad.

## 2015-06-14 NOTE — Telephone Encounter (Signed)
Lab orders are there   She should call to get appt  When she can,

## 2015-07-13 ENCOUNTER — Telehealth: Payer: Self-pay | Admitting: Internal Medicine

## 2015-07-13 NOTE — Telephone Encounter (Signed)
    Whitney Payne, Whitney Payne Female 11/30/1945 [id number] [place] [address] [phone] (Home) [phone] (Work)       Message  Received: 1 month ago    Nils Flack, [place]  [name], MD           Pt declined referral to endo at this time. Do you want to treat her?        Staff message   Copied to   Phone note  See phone message .

## 2015-11-20 ENCOUNTER — Other Ambulatory Visit: Payer: Self-pay | Admitting: Internal Medicine

## 2015-11-21 NOTE — Telephone Encounter (Signed)
Sent to the pharmacy by e-scribe. 

## 2015-11-21 NOTE — Telephone Encounter (Signed)
Ok to refill x 2  

## 2016-01-20 DIAGNOSIS — M25562 Pain in left knee: Secondary | ICD-10-CM | POA: Diagnosis not present

## 2016-03-02 DIAGNOSIS — M25562 Pain in left knee: Secondary | ICD-10-CM | POA: Diagnosis not present

## 2016-03-16 DIAGNOSIS — H52223 Regular astigmatism, bilateral: Secondary | ICD-10-CM | POA: Diagnosis not present

## 2016-03-16 DIAGNOSIS — Z961 Presence of intraocular lens: Secondary | ICD-10-CM | POA: Diagnosis not present

## 2016-03-16 DIAGNOSIS — H5203 Hypermetropia, bilateral: Secondary | ICD-10-CM | POA: Diagnosis not present

## 2016-07-29 ENCOUNTER — Encounter: Payer: Self-pay | Admitting: Internal Medicine

## 2016-07-29 ENCOUNTER — Other Ambulatory Visit: Payer: Self-pay | Admitting: Internal Medicine

## 2016-07-29 NOTE — Telephone Encounter (Signed)
Needs yearly OV 2 slots  For med eval last ov was 2016 Can refill x 1 in interim

## 2016-07-30 ENCOUNTER — Telehealth: Payer: Self-pay | Admitting: Family Medicine

## 2016-07-30 NOTE — Telephone Encounter (Signed)
Pt has been sch

## 2016-07-30 NOTE — Telephone Encounter (Signed)
[facility]  Message sent to scheduling.

## 2016-07-30 NOTE — Telephone Encounter (Signed)
Per WP, pt due for yearly check.  Needs 30 minutes.  Please help the pt to make an appt.  Have her come fasting for lab work.  Thanks!!!

## 2016-08-10 DIAGNOSIS — M1711 Unilateral primary osteoarthritis, right knee: Secondary | ICD-10-CM | POA: Diagnosis not present

## 2016-08-10 DIAGNOSIS — M1712 Unilateral primary osteoarthritis, left knee: Secondary | ICD-10-CM | POA: Diagnosis not present

## 2016-08-17 DIAGNOSIS — H18832 Recurrent erosion of cornea, left eye: Secondary | ICD-10-CM | POA: Diagnosis not present

## 2016-11-12 NOTE — Progress Notes (Signed)
No chief complaint on file.   HPI: Whitney Payne 71 y.o. comes in today for Preventive cpx visit .Since last visit. 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 TETANUS/TDAP  06/20/1963  . DEXA SCAN  06/19/2009  . PNA vac Low Risk Adult (1 of 2 - PCV13) 06/19/2009  . MAMMOGRAM  03/15/2010  . Hepatitis C Screening  11/13/2017 (Originally Nov 07, 1945)  . INFLUENZA VACCINE  01/01/2017  . 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 Last eye check UTDglasses  Safety:  Has smoke detector and wears seat belts.  No firearms. No excess sun exposure. Sees dentist regularly.  Falls: n   Depression: No anhedonia unusual crying or depressive symptoms  Nutrition: Eats well balanced diet; adequate calcium and vitamin D. No swallowing chewing problems.  Injury: no major injuries in the last six months.  Other healthcare providers:  Reviewed today .  Social: alne self employed work Occupational hygienist: up-to-date  Reviewed   ADLS:   There are no problems or need for assistance  driving, feeding, obtaining food, dressing, toileting and bathing, managing money using phone. She is independent.h   ROS:  ocass hot flushes off hrt  Doing ok  GEN/ HEENT: No fever, significant weight changes sweats headaches vision problems hearing changes, CV/ PULM; No chest pain shortness of breath cough, syncope,edema  change in exercise tolerance. GI /GU: No adominal pain, vomiting, change in bowel  habits. No blood in the stool. No significant GU symptoms. SKIN/HEME: ,no acute skin rashes suspicious lesions or bleeding. No lymphadenopathy, nodules, masses.  NEURO/ PSYCH:  No neurologic signs such as weakness numbness. No depression anxiety. IMM/ Allergy: No unusual infections.  Allergy .   REST of 12 system review negative except as per HPI   Past Medical History:  Diagnosis Date  . Abnormal EKG    nonspecific ST and T-wave changes  . Headache(784.0)   . Knee problem   . Postmenopausal HRT (hormone replacement therapy)   . Recurrent vertigo    neuro eval see mri report    Family History  Problem Relation Age of Onset  . Other Mother        died of se of patch narcotic, had artery diesease and amputation  . Other Father        collapsed lung- smoker    Social History   Social History  . Marital status: Legally Separated    Spouse name: N/A  . Number of children: N/A  . Years of education: N/A   Social History Main Topics  . Smoking status: Former Smoker    Quit date: 10/14/1963  . Smokeless tobacco: Never Used  . Alcohol use Yes     Comment: occasionally  . Drug use: No  . Sexual activity: Not Asked   Other Topics Concern  . 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 Calcium Carbonate-Vitamin D (CALCIUM + D PO) Take by mouth.    Marland Kitchen ibuprofen (ADVIL,MOTRIN) 800 MG tablet Take 1 tablet (800 mg total) by mouth 3 (three) times daily as needed.  . vitamin B-12 (CYANOCOBALAMIN) 100 MCG tablet Take 100 mcg by mouth daily.    . [DISCONTINUED] ibuprofen (ADVIL,MOTRIN) 800 MG tablet TAKE 1 TABLET BY MOUTH 3 TIMES DAILY AS NEEDED   No facility-administered encounter medications on file as of 11/13/2016.     EXAM:  BP 130/76 (BP Location: Left Arm, Patient Position: Sitting, Cuff Size: Normal)   Pulse 88    Temp 98.3 F (36.8 C) (Oral)   Ht 5' 3.5" (1.613 m)   Wt 146 lb (66.2 kg)   BMI 25.46 kg/m   Body mass index is 25.46 kg/m.  Physical Exam: Vital signs reviewed [id number] is a well-developed well-nourished alert cooperative   who appears stated age in no acute distress.  HEENT: normocephalic atraumatic , Eyes: PERRL EOM's full, conjunctiva clear, Nares: paten,t no deformity discharge or tenderness., Ears: no deformity EAC's clear TMs with normal landmarks. Mouth: clear OP, no lesions, edema.  Moist mucous membranes. Dentition in adequate repair. NECK: supple without masses,thyroid palpable  No nodule no pain no bruits CHEST/PULM:  Clear to auscultation and percussion breath sounds equal no wheeze , rales or rhonchi. No chest wall deformities or tenderness.Breast: normal by inspection . No dimpling, discharge, masses, tenderness or discharge . CV: PMI is nondisplaced, S1 S2 no gallops, murmurs, rubs. Peripheral pulses are full without delay.No JVD .  ABDOMEN: Bowel sounds normal nontender  No guard or rebound, no hepato splenomegal no CVA tenderness.   Extremtities:  No clubbing cyanosis or edema, no acute joint swelling or redness no focal atrophy left knee some arthritis changes no warmth increase size  NEURO:  Oriented x3, cranial nerves 3-12 appear to be intact, no obvious focal weakness,gait within normal limits no abnormal reflexes or asymmetrical SKIN: No acute rashes normal turgor, color, no bruising or petechiae. PSYCH: Oriented, good eye contact, no obvious depression anxiety, cognition and judgment appear normal. LN: no cervical axillary inguinal adenopathy shoddy  Ac nodes  Non enlarged no [place] nodes No noted deficits in memory, attention, and speech.     ASSESSMENT AND PLAN:  Discussed the following assessment and plan:  Visit for preventive health examination - Plan: TSH, T4, free, Lipid panel, Basic metabolic panel, CBC with Differential/Platelet, Hemoglobin A1c, Hepatic  function panel  Low TSH level - repeat no hx of disease - Plan: TSH, T4, free, Lipid panel, Basic metabolic panel, CBC with Differential/Platelet, Hemoglobin A1c, Hepatic function panel  Fasting hyperglycemia - Plan: TSH, T4, free, Lipid panel, Basic metabolic panel, CBC with Differential/Platelet, Hemoglobin A1c, Hepatic function panel  Arthritis of knee - left per ortho activiy disc is active - Plan: TSH, T4, free, Lipid panel, Basic metabolic panel, CBC with Differential/Platelet, Hemoglobin A1c, Hepatic function panel  Estrogen deficiency - Plan: TSH, T4, free, Lipid panel, Basic metabolic panel, CBC with Differential/Platelet, Hemoglobin A1c, Hepatic function panel, DG Bone Density  Medication management - moitor  renal cbc etc for ibuprofen arthritis managemnt - Plan: TSH, T4, free, Lipid panel, Basic metabolic panel, CBC with Differential/Platelet, Hemoglobin A1c, Hepatic function panel  23-polyvalent pneumococcal polysaccharide vaccine declined s well as prevnar  Doing well for  age .  To stay actrive   Bone health disc  Recheck thyroid  Levels  Monitoring etc . Decline immuni will get mammo  Advise sign up for my chart Patient Care Team: Panosh, Standley Brooking, MD as PCP - General Dorna Leitz, MD as Consulting Physician (Orthopedic Surgery)  Patient Instructions  Will notify you  of labs when available. Get appt bone density at the [place] [facility]  But contact us  If you want to get this test at another venue and we can send in order.   Consider shingrix vaccine  Get a pharmacy per insurance . consider colpguard   For colon screen  At next year  As opposed to yearly FIT test    Preventive Care 108 Years and Older, Female Preventive care refers to lifestyle choices and visits with your health care provider that can promote health and wellness. What does preventive care include?  A yearly physical exam. This is also called an annual well check.  Dental exams once or  twice a year.  Routine eye exams. Ask your health care provider how often you should have your eyes checked.  Personal lifestyle choices, including: ? Daily care of your teeth and gums. ? Regular physical activity. ? Eating a healthy diet. ? Avoiding tobacco and drug use. ? Limiting alcohol use. ? Practicing safe sex. ? Taking low-dose aspirin every day. ? Taking vitamin and mineral supplements as recommended by your health care provider. What happens during an annual well check? The services and screenings done by your health care provider during your annual well check will depend on your age, overall health, lifestyle risk factors, and family history of disease. Counseling Your health care provider may ask you questions about your:  Alcohol use.  Tobacco use.  Drug use.  Emotional well-being.  Home and relationship well-being.  Sexual activity.  Eating habits.  History of falls.  Memory and ability to understand (cognition).  Work and work Statistician.  Reproductive health.  Screening You may have the following tests or measurements:  Height, weight, and BMI.  Blood pressure.  Lipid and cholesterol levels. These may be checked every 5 years, or more frequently if you are over 49 years old.  Skin check.  Lung cancer screening. You may have this screening every year starting at age 73 if you have a 30-pack-year history of smoking and currently smoke or have quit within the past 15 years.  Fecal occult blood test (FOBT) of the stool. You may have this test every year starting at age 55.  Flexible sigmoidoscopy or colonoscopy. You may have a sigmoidoscopy every 5 years or a colonoscopy every 10 years starting at age 22.  Hepatitis C blood test.  Hepatitis B blood test.  Sexually transmitted disease (STD) testing.  Diabetes screening. This is done by checking your blood sugar (glucose) after you have not eaten for a while (fasting). You may have this done  every 1-3 years.  Bone density scan. This is done to screen for osteoporosis. You may have this done starting at age 32.  Mammogram. This may be done every 1-2 years. Talk to your health care provider about how often you should have regular mammograms.  Talk with your health care provider about your test results, treatment options, and if necessary, the need for more tests. Vaccines Your health care provider may recommend certain vaccines, such as:  Influenza vaccine. This is recommended every year.  Tetanus, diphtheria, and acellular pertussis (Tdap, Td) vaccine.  You may need a Td booster every 10 years.  Varicella vaccine. You may need this if you have not been vaccinated.  Zoster vaccine. You may need this after age 9.  Measles, mumps, and rubella (MMR) vaccine. You may need at least one dose of MMR if you were born in 1957 or later. You may also need a second dose.  Pneumococcal 13-valent conjugate (PCV13) vaccine. One dose is recommended after age 66.  Pneumococcal polysaccharide (PPSV23) vaccine. One dose is recommended after age 2.  Meningococcal vaccine. You may need this if you have certain conditions.  Hepatitis A vaccine. You may need this if you have certain conditions or if you travel or work in places where you may be exposed to hepatitis A.  Hepatitis B vaccine. You may need this if you have certain conditions or if you travel or work in places where you may be exposed to hepatitis B.  Haemophilus influenzae type b (Hib) vaccine. You may need this if you have certain conditions.  Talk to your health care provider about which screenings and vaccines you need and how often you need them. This information is not intended to replace advice given to you by your health care provider. Make sure you discuss any questions you have with your health care provider. Document Released: 06/16/2015 Document Revised: 02/07/2016 Document Reviewed: 03/21/2015 Elsevier Interactive  Patient Education  2017 [place] K. Panosh M.D.

## 2016-11-13 ENCOUNTER — Encounter: Payer: Self-pay | Admitting: Internal Medicine

## 2016-11-13 ENCOUNTER — Ambulatory Visit (INDEPENDENT_AMBULATORY_CARE_PROVIDER_SITE_OTHER): Payer: Medicare Other | Admitting: Internal Medicine

## 2016-11-13 VITALS — BP 130/76 | HR 88 | Temp 98.3°F | Ht 63.5 in | Wt 146.0 lb

## 2016-11-13 DIAGNOSIS — Z0000 Encounter for general adult medical examination without abnormal findings: Secondary | ICD-10-CM

## 2016-11-13 DIAGNOSIS — R946 Abnormal results of thyroid function studies: Secondary | ICD-10-CM | POA: Diagnosis not present

## 2016-11-13 DIAGNOSIS — E2839 Other primary ovarian failure: Secondary | ICD-10-CM

## 2016-11-13 DIAGNOSIS — M1710 Unilateral primary osteoarthritis, unspecified knee: Secondary | ICD-10-CM

## 2016-11-13 DIAGNOSIS — R7301 Impaired fasting glucose: Secondary | ICD-10-CM | POA: Diagnosis not present

## 2016-11-13 DIAGNOSIS — Z2821 Immunization not carried out because of patient refusal: Secondary | ICD-10-CM

## 2016-11-13 DIAGNOSIS — Z79899 Other long term (current) drug therapy: Secondary | ICD-10-CM

## 2016-11-13 DIAGNOSIS — R7989 Other specified abnormal findings of blood chemistry: Secondary | ICD-10-CM

## 2016-11-13 LAB — BASIC METABOLIC PANEL
BUN: 19 mg/dL (ref 6–23)
CALCIUM: 10.4 mg/dL (ref 8.4–10.5)
CO2: 28 meq/L (ref 19–32)
Chloride: 104 mEq/L (ref 96–112)
Creatinine, Ser: 0.78 mg/dL (ref 0.40–1.20)
GFR: 93.26 mL/min (ref >60.00)
Glucose, Bld: 91 mg/dL (ref 70–99)
POTASSIUM: 4.3 meq/L (ref 3.5–5.1)
SODIUM: 140 meq/L (ref 135–145)

## 2016-11-13 LAB — LIPID PANEL
CHOLESTEROL: 250 mg/dL — AB (ref 0–200)
HDL: 81 mg/dL (ref >39.00)
LDL Cholesterol: 149 mg/dL — ABNORMAL HIGH (ref 0–99)
NonHDL: 169.48
Total CHOL/HDL Ratio: 3
Triglycerides: 103 mg/dL (ref 0.0–149.0)
VLDL: 20.6 mg/dL (ref 0.0–40.0)

## 2016-11-13 LAB — CBC WITH DIFFERENTIAL/PLATELET
Basophils Absolute: 0 10*3/uL (ref 0.0–0.1)
Basophils Relative: 0.5 % (ref 0.0–3.0)
EOS PCT: 1.5 % (ref 0.0–5.0)
Eosinophils Absolute: 0.1 10*3/uL (ref 0.0–0.7)
HCT: 37.8 % (ref 36.0–46.0)
Hemoglobin: 12.3 g/dL (ref 12.0–15.0)
Lymphocytes Relative: 32 % (ref 12.0–46.0)
Lymphs Abs: 1.7 10*3/uL (ref 0.7–4.0)
MCHC: 32.6 g/dL (ref 30.0–36.0)
MCV: 95.9 fl (ref 78.0–100.0)
MONO ABS: 0.5 10*3/uL (ref 0.1–1.0)
MONOS PCT: 8.8 % (ref 3.0–12.0)
NEUTROS ABS: 3 10*3/uL (ref 1.4–7.7)
Neutrophils Relative %: 57.2 % (ref 43.0–77.0)
PLATELETS: 331 10*3/uL (ref 150.0–400.0)
RBC: 3.94 Mil/uL (ref 3.87–5.11)
RDW: 14.7 % (ref 11.5–15.5)
WBC: 5.2 10*3/uL (ref 4.0–10.5)

## 2016-11-13 LAB — TSH: TSH: 0.39 u[IU]/mL (ref 0.35–4.50)

## 2016-11-13 LAB — HEPATIC FUNCTION PANEL
ALT: 13 U/L (ref 0–35)
AST: 15 U/L (ref 0–37)
Albumin: 4.4 g/dL (ref 3.5–5.2)
Alkaline Phosphatase: 65 U/L (ref 39–117)
Bilirubin, Direct: 0.1 mg/dL (ref 0.0–0.3)
TOTAL PROTEIN: 7.3 g/dL (ref 6.0–8.3)
Total Bilirubin: 0.5 mg/dL (ref 0.2–1.2)

## 2016-11-13 LAB — T4, FREE: Free T4: 0.7 ng/dL (ref 0.60–1.60)

## 2016-11-13 LAB — HEMOGLOBIN A1C: Hgb A1c MFr Bld: 6.4 % (ref 4.6–6.5)

## 2016-11-13 MED ORDER — IBUPROFEN 800 MG PO TABS: 800.0000 mg | ORAL_TABLET | Freq: Three times a day (TID) | ORAL | 3 refills | Status: DC | PRN

## 2016-11-13 NOTE — Patient Instructions (Addendum)
Will notify you  of labs when available. Get appt bone density at the [place] [facility]  But contact us  If you want to get this test at another venue and we can send in order.   Consider shingrix vaccine  Get a pharmacy per insurance . consider colpguard   For colon screen  At next year  As opposed to yearly FIT test    Preventive Care 72 Years and Older, Female Preventive care refers to lifestyle choices and visits with your health care provider that can promote health and wellness. What does preventive care include?  A yearly physical exam. This is also called an annual well check.  Dental exams once or twice a year.  Routine eye exams. Ask your health care provider how often you should have your eyes checked.  Personal lifestyle choices, including: ? Daily care of your teeth and gums. ? Regular physical activity. ? Eating a healthy diet. ? Avoiding tobacco and drug use. ? Limiting alcohol use. ? Practicing safe sex. ? Taking low-dose aspirin every day. ? Taking vitamin and mineral supplements as recommended by your health care provider. What happens during an annual well check? The services and screenings done by your health care provider during your annual well check will depend on your age, overall health, lifestyle risk factors, and family history of disease. Counseling Your health care provider may ask you questions about your:  Alcohol use.  Tobacco use.  Drug use.  Emotional well-being.  Home and relationship well-being.  Sexual activity.  Eating habits.  History of falls.  Memory and ability to understand (cognition).  Work and work Statistician.  Reproductive health.  Screening You may have the following tests or measurements:  Height, weight, and BMI.  Blood pressure.  Lipid and cholesterol levels. These may be checked every 5 years, or more frequently if you are over 26 years old.  Skin check.  Lung cancer screening. You may  have this screening every year starting at age 72 if you have a 30-pack-year history of smoking and currently smoke or have quit within the past 15 years.  Fecal occult blood test (FOBT) of the stool. You may have this test every year starting at age 72.  Flexible sigmoidoscopy or colonoscopy. You may have a sigmoidoscopy every 5 years or a colonoscopy every 10 years starting at age 72.  Hepatitis C blood test.  Hepatitis B blood test.  Sexually transmitted disease (STD) testing.  Diabetes screening. This is done by checking your blood sugar (glucose) after you have not eaten for a while (fasting). You may have this done every 1-3 years.  Bone density scan. This is done to screen for osteoporosis. You may have this done starting at age 72.  Mammogram. This may be done every 1-2 years. Talk to your health care provider about how often you should have regular mammograms.  Talk with your health care provider about your test results, treatment options, and if necessary, the need for more tests. Vaccines Your health care provider may recommend certain vaccines, such as:  Influenza vaccine. This is recommended every year.  Tetanus, diphtheria, and acellular pertussis (Tdap, Td) vaccine. You may need a Td booster every 10 years.  Varicella vaccine. You may need this if you have not been vaccinated.  Zoster vaccine. You may need this after age 72.  Measles, mumps, and rubella (MMR) vaccine. You may need at least one dose of MMR if you were born in 1957 or later.  You may also need a second dose.  Pneumococcal 13-valent conjugate (PCV13) vaccine. One dose is recommended after age 72.  Pneumococcal polysaccharide (PPSV23) vaccine. One dose is recommended after age 72.  Meningococcal vaccine. You may need this if you have certain conditions.  Hepatitis A vaccine. You may need this if you have certain conditions or if you travel or work in places where you may be exposed to hepatitis  A.  Hepatitis B vaccine. You may need this if you have certain conditions or if you travel or work in places where you may be exposed to hepatitis B.  Haemophilus influenzae type b (Hib) vaccine. You may need this if you have certain conditions.  Talk to your health care provider about which screenings and vaccines you need and how often you need them. This information is not intended to replace advice given to you by your health care provider. Make sure you discuss any questions you have with your health care provider. Document Released: 06/16/2015 Document Revised: 02/07/2016 Document Reviewed: 03/21/2015 Elsevier Interactive Patient Education  2017 [employer].

## 2016-11-14 ENCOUNTER — Telehealth: Payer: Self-pay | Admitting: Internal Medicine

## 2016-11-14 ENCOUNTER — Other Ambulatory Visit: Payer: Self-pay | Admitting: Emergency Medicine

## 2016-11-14 DIAGNOSIS — E2839 Other primary ovarian failure: Secondary | ICD-10-CM

## 2016-11-14 NOTE — Telephone Encounter (Signed)
° ° °  Pt call to say that she [facility] and [facility]amo

## 2016-11-15 NOTE — Telephone Encounter (Signed)
Spoke and explained that the order for the bone density scan has been ordered and she can schedule the mammo at the appointment for the bone density. Pt understood and had no further question.

## 2016-12-14 ENCOUNTER — Encounter: Payer: Self-pay | Admitting: Family Medicine

## 2016-12-14 ENCOUNTER — Ambulatory Visit (INDEPENDENT_AMBULATORY_CARE_PROVIDER_SITE_OTHER): Payer: Medicare Other | Admitting: Family Medicine

## 2016-12-14 VITALS — BP 130/80 | HR 82 | Temp 97.9°F | Ht 63.5 in | Wt 145.8 lb

## 2016-12-14 DIAGNOSIS — L509 Urticaria, unspecified: Secondary | ICD-10-CM

## 2016-12-14 MED ORDER — CETIRIZINE HCL 10 MG PO TABS: 10.0000 mg | ORAL_TABLET | Freq: Every day | ORAL | 11 refills | Status: DC

## 2016-12-14 NOTE — Patient Instructions (Signed)
Cetirizine tablets What is this medicine? CETIRIZINE (se TI ra zeen) is an antihistamine. This medicine is used to treat or prevent symptoms of allergies. It is also used to help reduce itchy skin rash and hives. This medicine may be used for other purposes; ask your health care provider or pharmacist if you have questions. COMMON BRAND NAME(S): All Day Allergy, Zyrtec, Zyrtec Hives Relief What should I tell my health care provider before I take this medicine? They need to know if you have any of these conditions: -kidney disease -liver disease -an unusual or allergic reaction to cetirizine, hydroxyzine, other medicines, foods, dyes, or preservatives -pregnant or trying to get pregnant -breast-feeding How should I use this medicine? Take this medicine by mouth with a glass of water. Follow the directions on the prescription label. You can take this medicine with food or on an empty stomach. Take your medicine at regular times. Do not take more often than directed. You may need to take this medicine for several days before your symptoms improve. Talk to your pediatrician regarding the use of this medicine in children. Special care may be needed. While this drug may be prescribed for children as young as 6 years of age for selected conditions, precautions do apply. Overdosage: If you think you have taken too much of this medicine contact a poison control center or emergency room at once. NOTE: This medicine is only for you. Do not share this medicine with others. What if I miss a dose? If you miss a dose, take it as soon as you can. If it is almost time for your next dose, take only that dose. Do not take double or extra doses. What may interact with this medicine? -alcohol -certain medicines for anxiety or sleep -narcotic medicines for pain -other medicines for colds or allergies This list may not describe all possible interactions. Give your health care provider a list of all the medicines,  herbs, non-prescription drugs, or dietary supplements you use. Also tell them if you smoke, drink alcohol, or use illegal drugs. Some items may interact with your medicine. What should I watch for while using this medicine? Visit your doctor or health care professional for regular checks on your health. Tell your doctor if your symptoms do not improve. You may get drowsy or dizzy. Do not drive, use machinery, or do anything that needs mental alertness until you know how this medicine affects you. Do not stand or sit up quickly, especially if you are an older patient. This reduces the risk of dizzy or fainting spells. Your mouth may get dry. Chewing sugarless gum or sucking hard candy, and drinking plenty of water may help. Contact your doctor if the problem does not go away or is severe. What side effects may I notice from receiving this medicine? Side effects that you should report to your doctor or health care professional as soon as possible: -allergic reactions like skin rash, itching or hives, swelling of the face, lips, or tongue -changes in vision or hearing -fast or irregular heartbeat -trouble passing urine or change in the amount of urine Side effects that usually do not require medical attention (report to your doctor or health care professional if they continue or are bothersome): -dizziness -dry mouth -irritability -sore throat -stomach pain -tiredness This list may not describe all possible side effects. Call your doctor for medical advice about side effects. You may report side effects to FDA at 1-800-FDA-1088. Where should I keep my medicine? Keep out of   the reach of children. Store at room temperature between 15 and 30 degrees C (59 and 86 degrees F). Throw away any unused medicine after the expiration date. NOTE: This sheet is a summary. It may not cover all possible information. If you have questions about this medicine, talk to your doctor, pharmacist, or health care  provider.  2018 Elsevier/Gold Standard (2014-06-14 13:44:42)  

## 2016-12-14 NOTE — Progress Notes (Signed)
Patient ID: Whitney Payne, female    DOB: 09/13/1945  Age: 71 y.o. MRN: [id number]    Subjective:  Subjective  HPI Whitney Payne presents for hives in arms , legs around waist --- it comes and goes several times a week  She is using otc cortisone cream which helps.   She used 2 tubes of cream last week.    Review of Systems  Constitutional: Negative for appetite change, diaphoresis, fatigue and unexpected weight change.  Eyes: Negative for pain, redness and visual disturbance.  Respiratory: Negative for cough, chest tightness, shortness of breath and wheezing.   Cardiovascular: Negative for chest pain, palpitations and leg swelling.  Endocrine: Negative for cold intolerance, heat intolerance, polydipsia, polyphagia and polyuria.  Genitourinary: Negative for difficulty urinating, dysuria and frequency.  Skin: Positive for rash. Negative for color change.  Neurological: Negative for dizziness, light-headedness, numbness and headaches.    History Past Medical History:  Diagnosis Date  . Abnormal EKG    nonspecific ST and T-wave changes  . Headache(784.0)   . Knee problem   . Postmenopausal HRT (hormone replacement therapy)   . Recurrent vertigo    neuro eval see mri report    She has a past surgical history that includes Abdominal hysterectomy; Tubal ligation; and Cataract extraction.   Her family history includes Other in her father and mother.She reports that she quit smoking about 53 years ago. She has never used smokeless tobacco. She reports that she drinks alcohol. She reports that she does not use drugs.  Current Outpatient Prescriptions on File Prior to Visit  Medication Sig Dispense Refill  . Calcium Carbonate-Vitamin D (CALCIUM + D PO) Take by mouth.      Marland Kitchen ibuprofen (ADVIL,MOTRIN) 800 MG tablet Take 1 tablet (800 mg total) by mouth 3 (three) times daily as needed. 30 tablet 3  . vitamin B-12 (CYANOCOBALAMIN) 100 MCG tablet Take 100 mcg by mouth daily.       No  current facility-administered medications on file prior to visit.      Objective:  Objective  Physical Exam  Skin: Skin is warm and dry. Rash noted. Rash is urticarial. There is erythema.     Nursing note and vitals reviewed.  BP 130/80 (BP Location: Left Arm, Patient Position: Sitting, Cuff Size: Normal)   Pulse 82   Temp 97.9 F (36.6 C) (Oral)   Ht 5' 3.5" (1.613 m)   Wt 145 lb 12 oz (66.1 kg)   SpO2 98%   BMI 25.41 kg/m  Wt Readings from Last 3 Encounters:  12/14/16 145 lb 12 oz (66.1 kg)  11/13/16 146 lb (66.2 kg)  06/09/15 141 lb 4.8 oz (64.1 kg)     Lab Results  Component Value Date   WBC 5.2 11/13/2016   HGB 12.3 11/13/2016   HCT 37.8 11/13/2016   PLT 331.0 11/13/2016   GLUCOSE 91 11/13/2016   CHOL 250 (H) 11/13/2016   TRIG 103.0 11/13/2016   HDL 81.00 11/13/2016   LDLCALC 149 (H) 11/13/2016   ALT 13 11/13/2016   AST 15 11/13/2016   NA 140 11/13/2016   K 4.3 11/13/2016   CL 104 11/13/2016   CREATININE 0.78 11/13/2016   BUN 19 11/13/2016   CO2 28 11/13/2016   TSH 0.39 11/13/2016   HGBA1C 6.4 11/13/2016    Mr Brain W Wo Contrast  Result Date: 10/23/2013 CLINICAL DATA:  Vertigo for 3 months.  Nausea. EXAM: MRI HEAD WITHOUT AND WITH CONTRAST TECHNIQUE: Multiplanar, multiecho  pulse sequences of the brain and surrounding structures were obtained without and with intravenous contrast. CONTRAST:  13mL MULTIHANCE GADOBENATE DIMEGLUMINE 529 MG/ML IV SOLN COMPARISON:  CT head 02/16/2005. FINDINGS: No evidence for acute infarction, hemorrhage, mass lesion, hydrocephalus, or extra-axial fluid. There is no significant atrophy or white matter disease. Flow voids are maintained throughout the carotid, basilar, and vertebral arteries. There are no areas of chronic hemorrhage. Thin section imaging through the posterior fossa reveals no evidence for vestibular schwannoma or posterior fossa mass. No vascular loop is evident. No temporal bone inflammatory process. Major dural  venous sinuses are patent including both transverse and sigmoid regions. No evidence for basilar thrombosis or posterior fossa stroke Incompletely evaluated is bilateral level II/level V cervical adenopathy. None of the lymph nodes appear greater than 6 mm diameter, but correlate clinically for lymph node enlargement elsewhere. CT neck with contrast could be helpful in further evaluation. Bilateral cataract extraction. No sinus disease. No nasopharyngeal masses. Similar appearance to prior CT. IMPRESSION: Normal for age cerebral volume. No significant white matter disease. Special attention was directed the posterior fossa where no vestibular pathway abnormality or temporal bone inflammatory process is evident. Incompletely evaluated upper cervical shotty lymphadenopathy. Electronically Signed   By: Davonna BellingJohn  Curnes M.D.   On: 10/23/2013 10:42     Assessment & Plan:  Plan  I am having Ms. Shubert start on cetirizine. I am also having her maintain her Calcium Carbonate-Vitamin D (CALCIUM + D PO), vitamin B-12, and ibuprofen.  Meds ordered this encounter  Medications  . cetirizine (ZYRTEC) 10 MG tablet    Sig: Take 1 tablet (10 mg total) by mouth daily.    Dispense:  30 tablet    Refill:  11    Problem List Items Addressed This Visit    None    Visit Diagnoses    Hives    -  Primary   Relevant Medications   cetirizine (ZYRTEC) 10 MG tablet        Pt can con't to use cortisone cream otc     May need allergy referral if symptoms persist  Follow-up: No Follow-up on file.  Donato SchultzYvonne R Lowne Chase, DO

## 2016-12-26 ENCOUNTER — Other Ambulatory Visit: Payer: Self-pay | Admitting: Internal Medicine

## 2016-12-26 DIAGNOSIS — Z1231 Encounter for screening mammogram for malignant neoplasm of breast: Secondary | ICD-10-CM

## 2017-01-03 ENCOUNTER — Ambulatory Visit
Admission: RE | Admit: 2017-01-03 | Discharge: 2017-01-03 | Disposition: A | Discharge status: Home / Self Care | Payer: Medicare Other | Source: Ambulatory Visit | Attending: Internal Medicine | Admitting: Internal Medicine

## 2017-01-03 DIAGNOSIS — Z780 Asymptomatic menopausal state: Secondary | ICD-10-CM | POA: Diagnosis not present

## 2017-01-03 DIAGNOSIS — E2839 Other primary ovarian failure: Secondary | ICD-10-CM

## 2017-01-03 DIAGNOSIS — Z1231 Encounter for screening mammogram for malignant neoplasm of breast: Secondary | ICD-10-CM | POA: Diagnosis not present

## 2017-01-07 ENCOUNTER — Telehealth: Payer: Self-pay | Admitting: Internal Medicine

## 2017-01-07 NOTE — Telephone Encounter (Signed)
Whitney Payne pt is calling to get results for bone density and mammogram.

## 2017-01-07 NOTE — Telephone Encounter (Signed)
Spoke with patient regarding results from bone density being normal and as soon as Dr. Fabian SharpPanosh sends me the results from mammogram I will call the patient. Also sent patient a link to reset her password.

## 2017-08-18 ENCOUNTER — Other Ambulatory Visit: Payer: Self-pay | Admitting: Internal Medicine

## 2017-11-24 ENCOUNTER — Other Ambulatory Visit: Payer: Self-pay | Admitting: Internal Medicine

## 2017-11-24 DIAGNOSIS — Z1231 Encounter for screening mammogram for malignant neoplasm of breast: Secondary | ICD-10-CM

## 2017-12-02 ENCOUNTER — Encounter: Payer: Self-pay | Admitting: Internal Medicine

## 2017-12-02 LAB — COLOGUARD: Cologuard: NEGATIVE

## 2017-12-02 NOTE — Progress Notes (Signed)
Chief Complaint  Patient presents with  . Annual Exam    HPI: Whitney Payne 72 y.o. comes in today for Preventive Medicare exam/ had  insurance   provider come to home and has  Paper  Review    ibuprofen once a week when weather changes   Left knee arthritis  Has had eye check  Stool test  Card done . appt for mammo .   Health Maintenance  Topic Date Due  . Hepatitis C Screening  [birth year]  . TETANUS/TDAP  06/20/1963  . PNA vac Low Risk Adult (1 of 2 - PCV13) 06/19/2009  . COLON CANCER SCREENING ANNUAL FOBT  07/29/2017  . COLONOSCOPY  07/08/2018 (Originally 06/19/1994)  . INFLUENZA VACCINE  01/01/2018  . MAMMOGRAM  01/04/2019  . DEXA SCAN  Completed   Health Maintenance Review LIFESTYLE:  Exercise:  Active  And flexes  Tobacco/ETS: no Alcohol:  ocass Sugar beverages: sweet tea.  splenda  Sleep: 5   Drug use: no HH: 1  Terrier .  Work and beyond   Hearing:  [place]:  No limitations at present . Last eye check UTD  Safety:  Has smoke detector and wears seat belts.  No firearms. No excess sun exposure. Sees dentist regularly.  Falls: no  Memory: Felt to be good  , no concern from her or her family.  Depression: No anhedonia unusual crying or depressive symptoms  Nutrition: Eats well balanced diet; adequate calcium and vitamin D. No swallowing chewing problems.  ADLS:   There are no problems or need for assistance  driving, feeding, obtaining food, dressing, toileting and bathing, managing money using phone. She is independent.  Runs a business   Day care  FT     ROS:  See above  GEN/ HEENT: No fever, significant weight changes sweats headaches vision problems hearing changes, CV/ PULM; No chest pain shortness of breath cough, syncope,edema  change in exercise tolerance. GI /GU: No adominal pain, vomiting, change in bowel habits. No blood in the stool. No significant GU symptoms. SKIN/HEME: ,no acute skin rashes suspicious lesions or bleeding. No  lymphadenopathy, nodules, masses.  NEURO/ PSYCH:  No neurologic signs such as weakness numbness. No depression anxiety. IMM/ Allergy: No unusual infections.  Allergy .   REST of 12 system review negative except as per HPI   Past Medical History:  Diagnosis Date  . Abnormal EKG    nonspecific ST and T-wave changes  . Headache(784.0)   . Knee problem   . Postmenopausal HRT (hormone replacement therapy)   . Recurrent vertigo    neuro eval see mri report    Family History  Problem Relation Age of Onset  . Other Mother        died of se of patch narcotic, had artery diesease and amputation  . Other Father        collapsed lung- smoker    Social History   Socioeconomic History  . Marital status: Legally Separated    Spouse name: Not on file  . Number of children: Not on file  . Years of education: Not on file  . Highest education level: Not on file  Occupational History  . Not on file  Social Needs  . Financial resource strain: Not on file  . Food insecurity:    Worry: Not on file    Inability: Not on file  . Transportation needs:    Medical: Not on file    Non-medical: Not on  file  Tobacco Use  . Smoking status: Former Smoker    Last attempt to quit: 10/14/1963    Years since quitting: 54.1  . Smokeless tobacco: Never Used  Substance and Sexual Activity  . Alcohol use: Yes    Comment: occasionally  . Drug use: No  . Sexual activity: Not on file  Lifestyle  . Physical activity:    Days per week: Not on file    Minutes per session: Not on file  . Stress: Not on file  Relationships  . Social connections:    Talks on phone: Not on file    Gets together: Not on file    Attends religious service: Not on file    Active member of club or organization: Not on file    Attends meetings of clubs or organizations: Not on file    Relationship status: Not on file  Other Topics Concern  . 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 Calcium Carbonate-Vitamin D (CALCIUM + D PO) Take by mouth.    . cetirizine (ZYRTEC) 10 MG tablet Take 1 tablet (10 mg total) by mouth daily.  Marland Kitchen ibuprofen (ADVIL,MOTRIN) 800 MG tablet TAKE 1 TABLET (800 MG TOTAL) BY MOUTH 3 (THREE) TIMES DAILY AS NEEDED.  Marland Kitchen vitamin B-12 (CYANOCOBALAMIN) 100 MCG tablet Take 100 mcg by mouth daily.     No facility-administered encounter medications on file as of 12/03/2017.     EXAM:  BP (!) 141/82   Pulse 85   Temp 98.2 F (36.8 C)   Ht 5' 3.5" (1.613 m)   Wt 145 lb (65.8 kg)   BMI 25.28 kg/m   Body mass index is 25.28 kg/m.  Physical Exam: Vital signs reviewed MVH:QION is a well-developed well-nourished alert cooperative   who appears stated age in no acute distress.  HEENT: normocephalic atraumatic , Eyes: PERRL EOM's full, conjunctiva clear, Nares: paten,t no deformity discharge or tenderness., Ears: no deformity EAC's some wax in ears. TMs with normal landmarks. Mouth: clear OP, no lesions, edema.  Moist mucous membranes. Dentition in adequate repair. NECK: supple without masses, thyromegaly or bruits. CHEST/PULM:  Clear to auscultation and percussion breath sounds equal no wheeze , rales or rhonchi. No chest wall deformities or tenderness. CV: PMI is nondisplaced, S1 S2 no gallops, murmurs, rubs. Peripheral pulses are full without delay.No JVD . Breast: normal by inspection . No dimpling, discharge, masses, tenderness or discharge . ABDOMEN: Bowel sounds normal nontender  No guard or rebound, no hepato splenomegal no CVA tenderness.   Extremtities:  No clubbing cyanosis or edema, no acute joint swelling or redness no focal atrophy NEURO:  Oriented x3, cranial nerves 3-12 appear to be intact, no obvious focal weakness,gait within normal limits no abnormal reflexes or asymmetrical SKIN: No acute rashes normal turgor,  color, no bruising or petechiae. PSYCH: Oriented, good eye contact, no obvious depression anxiety, cognition and judgment appear normal. LN: no cervical axillary inguinal adenopathy No noted deficits in memory, attention, and speech.   Lab Results  Component Value Date   WBC 5.2 11/13/2016   HGB 12.3 11/13/2016   HCT 37.8 11/13/2016   PLT 331.0 11/13/2016   GLUCOSE 91 11/13/2016   CHOL 250 (H) 11/13/2016   TRIG 103.0 11/13/2016   HDL 81.00 11/13/2016  LDLCALC 149 (H) 11/13/2016   ALT 13 11/13/2016   AST 15 11/13/2016   NA 140 11/13/2016   K 4.3 11/13/2016   CL 104 11/13/2016   CREATININE 0.78 11/13/2016   BUN 19 11/13/2016   CO2 28 11/13/2016   TSH 0.39 11/13/2016   HGBA1C 6.4 11/13/2016    ASSESSMENT AND PLAN:  Discussed the following assessment and plan:  Visit for preventive health examination  Hyperlipidemia, unspecified hyperlipidemia type - Plan: Hepatitis C antibody, Basic metabolic panel, Lipid panel  Hyperglycemia - Plan: Hepatitis C antibody, Basic metabolic panel  Medication management - Plan: Hepatitis C antibody, Basic metabolic panel  Need for hepatitis C screening test - Plan: Hepatitis C antibody, Basic metabolic panel  Pneumococcal vaccination declined by patient  Arthritis of left knee Declines some  testing   pna  vaccine  To get mammo .  Has  Stool test  Shared Decision Making eating healthy   Monitor bp for ht and intervention   Use of nsaids check renal function.  Patient Care Team: Panosh, Standley Brooking, MD as PCP - Meryle Ready, MD as Consulting Physician (Orthopedic Surgery)  Patient Instructions  Continue lifestyle intervention healthy eating and exercise . Make sure BP readings are below   140/90   Take blood pressure readings twice a day for 7- 10 days and then periodically .To ensure below 140/90   .   Let us know if not controlled .  Shingrix. vaccineat your pharmacy     DASH Eating Plan DASH stands for "Dietary  Approaches to Stop Hypertension." The DASH eating plan is a healthy eating plan that has been shown to reduce high blood pressure (hypertension). It may also reduce your risk for type 2 diabetes, heart disease, and stroke. The DASH eating plan may also help with weight loss. What are tips for following this plan? General guidelines  Avoid eating more than 2,300 mg (milligrams) of salt (sodium) a day. If you have hypertension, you may need to reduce your sodium intake to 1,500 mg a day.  Limit alcohol intake to no more than 1 drink a day for nonpregnant women and 2 drinks a day for men. One drink equals 12 oz of beer, 5 oz of wine, or 1 oz of hard liquor.  Work with your health care provider to maintain a healthy body weight or to lose weight. Ask what an ideal weight is for you.  Get at least 30 minutes of exercise that causes your heart to beat faster (aerobic exercise) most days of the week. Activities may include walking, swimming, or biking.  Work with your health care provider or diet and nutrition specialist (dietitian) to adjust your eating plan to your individual calorie needs. Reading food labels  Check food labels for the amount of sodium per serving. Choose foods with less than 5 percent of the Daily Value of sodium. Generally, foods with less than 300 mg of sodium per serving fit into this eating plan.  To find whole grains, look for the word "whole" as the first word in the ingredient list. Shopping  Buy products labeled as "low-sodium" or "no salt added."  Buy fresh foods. Avoid canned foods and premade or frozen meals. Cooking  Avoid adding salt when cooking. Use salt-free seasonings or herbs instead of table salt or sea salt. Check with your health care provider or pharmacist before using salt substitutes.  Do not fry foods. Cook foods using healthy methods such as baking, boiling, grilling, and broiling  instead.  Cook with heart-healthy oils, such as olive, canola,  soybean, or sunflower oil. Meal planning   Eat a balanced diet that includes: ? 5 or more servings of fruits and vegetables each day. At each meal, try to fill half of your plate with fruits and vegetables. ? Up to 6-8 servings of whole grains each day. ? Less than 6 oz of lean meat, poultry, or fish each day. A 3-oz serving of meat is about the same size as a deck of cards. One egg equals 1 oz. ? 2 servings of low-fat dairy each day. ? A serving of nuts, seeds, or beans 5 times each week. ? Heart-healthy fats. Healthy fats called Omega-3 fatty acids are found in foods such as flaxseeds and coldwater fish, like sardines, salmon, and mackerel.  Limit how much you eat of the following: ? Canned or prepackaged foods. ? Food that is high in trans fat, such as fried foods. ? Food that is high in saturated fat, such as fatty meat. ? Sweets, desserts, sugary drinks, and other foods with added sugar. ? Full-fat dairy products.  Do not salt foods before eating.  Try to eat at least 2 vegetarian meals each week.  Eat more home-cooked food and less restaurant, buffet, and fast food.  When eating at a restaurant, ask that your food be prepared with less salt or no salt, if possible. What foods are recommended? The items listed may not be a complete list. Talk with your dietitian about what dietary choices are best for you. Grains Whole-grain or whole-wheat bread. Whole-grain or whole-wheat pasta. Brown rice. [place]. Bulgur. Whole-grain and low-sodium cereals. Pita bread. Low-fat, low-sodium crackers. Whole-wheat flour tortillas. Vegetables Fresh or frozen vegetables (raw, steamed, roasted, or grilled). Low-sodium or reduced-sodium tomato and vegetable juice. Low-sodium or reduced-sodium tomato sauce and tomato paste. Low-sodium or reduced-sodium canned vegetables. Fruits All fresh, dried, or frozen fruit. Canned fruit in natural juice (without added sugar). Meat and other protein  foods Skinless chicken or [place]. Ground chicken or [place]. Pork with fat trimmed off. Fish and seafood. Egg whites. Dried beans, peas, or lentils. Unsalted nuts, nut butters, and seeds. Unsalted canned beans. Lean cuts of beef with fat trimmed off. Low-sodium, lean deli meat. Dairy Low-fat (1%) or fat-free (skim) milk. Fat-free, low-fat, or reduced-fat cheeses. Nonfat, low-sodium ricotta or cottage cheese. Low-fat or nonfat yogurt. Low-fat, low-sodium cheese. Fats and oils Soft margarine without trans fats. Vegetable oil. Low-fat, reduced-fat, or light mayonnaise and salad dressings (reduced-sodium). Canola, safflower, olive, soybean, and sunflower oils. Avocado. Seasoning and other foods Herbs. Spices. Seasoning mixes without salt. Unsalted popcorn and pretzels. Fat-free sweets. What foods are not recommended? The items listed may not be a complete list. Talk with your dietitian about what dietary choices are best for you. Grains Baked goods made with fat, such as croissants, muffins, or some breads. Dry pasta or rice meal packs. Vegetables Creamed or fried vegetables. Vegetables in a cheese sauce. Regular canned vegetables (not low-sodium or reduced-sodium). Regular canned tomato sauce and paste (not low-sodium or reduced-sodium). Regular tomato and vegetable juice (not low-sodium or reduced-sodium). Angie Fava. Olives. Fruits Canned fruit in a light or heavy syrup. Fried fruit. Fruit in cream or butter sauce. Meat and other protein foods Fatty cuts of meat. Ribs. Fried meat. Berniece Salines. Sausage. Bologna and other processed lunch meats. Salami. Fatback. Hotdogs. Bratwurst. Salted nuts and seeds. Canned beans with added salt. Canned or smoked fish. Whole eggs or egg yolks. Chicken or [place] with  skin. Dairy Whole or 2% milk, cream, and half-and-half. Whole or full-fat cream cheese. Whole-fat or sweetened yogurt. Full-fat cheese. Nondairy creamers. Whipped toppings. Processed cheese and cheese  spreads. Fats and oils Butter. Stick margarine. Lard. Shortening. Ghee. Bacon fat. Tropical oils, such as coconut, palm kernel, or palm oil. Seasoning and other foods Salted popcorn and pretzels. Onion salt, garlic salt, seasoned salt, table salt, and sea salt. Worcestershire sauce. Tartar sauce. Barbecue sauce. Teriyaki sauce. Soy sauce, including reduced-sodium. Steak sauce. Canned and packaged gravies. Fish sauce. Oyster sauce. Cocktail sauce. Horseradish that you find on the shelf. Ketchup. Mustard. Meat flavorings and tenderizers. Bouillon cubes. Hot sauce and Tabasco sauce. Premade or packaged marinades. Premade or packaged taco seasonings. Relishes. Regular salad dressings. Where to find more information:  National Heart, Lung, and [place]: https://wilson-eaton.com/  American Heart Association: www.heart.org Summary  The DASH eating plan is a healthy eating plan that has been shown to reduce high blood pressure (hypertension). It may also reduce your risk for type 2 diabetes, heart disease, and stroke.  With the DASH eating plan, you should limit salt (sodium) intake to 2,300 mg a day. If you have hypertension, you may need to reduce your sodium intake to 1,500 mg a day.  When on the DASH eating plan, aim to eat more fresh fruits and vegetables, whole grains, lean proteins, low-fat dairy, and heart-healthy fats.  Work with your health care provider or diet and nutrition specialist (dietitian) to adjust your eating plan to your individual calorie needs. This information is not intended to replace advice given to you by your health care provider. Make sure you discuss any questions you have with your health care provider. Document Released: 05/09/2011 Document Revised: 05/13/2016 Document Reviewed: 05/13/2016 Elsevier Interactive Patient Education  2018 [place] Maintenance, Female Adopting a healthy lifestyle and getting preventive care can go a long way to promote  health and wellness. Talk with your health care provider about what schedule of regular examinations is right for you. This is a good chance for you to check in with your provider about disease prevention and staying healthy. In between checkups, there are plenty of things you can do on your own. Experts have done a lot of research about which lifestyle changes and preventive measures are most likely to keep you healthy. Ask your health care provider for more information. Weight and diet Eat a healthy diet  Be sure to include plenty of vegetables, fruits, low-fat dairy products, and lean protein.  Do not eat a lot of foods high in solid fats, added sugars, or salt.  Get regular exercise. This is one of the most important things you can do for your health. ? Most adults should exercise for at least 150 minutes each week. The exercise should increase your heart rate and make you sweat (moderate-intensity exercise). ? Most adults should also do strengthening exercises at least twice a week. This is in addition to the moderate-intensity exercise.  Maintain a healthy weight  Body mass index (BMI) is a measurement that can be used to identify possible weight problems. It estimates body fat based on height and weight. Your health care provider can help determine your BMI and help you achieve or maintain a healthy weight.  For females 80 years of age and older: ? A BMI below 18.5 is considered underweight. ? A BMI of 18.5 to 24.9 is normal. ? A BMI of 25 to 29.9 is considered overweight. ? A BMI  of 30 and above is considered obese.  Watch levels of cholesterol and blood lipids  You should start having your blood tested for lipids and cholesterol at 73 years of age, then have this test every 5 years.  You may need to have your cholesterol levels checked more often if: ? Your lipid or cholesterol levels are high. ? You are older than 73 years of age. ? You are at high risk for heart  disease.  Cancer screening Lung Cancer  Lung cancer screening is recommended for adults 60-72 years old who are at high risk for lung cancer because of a history of smoking.  A yearly low-dose CT scan of the lungs is recommended for people who: ? Currently smoke. ? Have quit within the past 15 years. ? Have at least a 30-pack-year history of smoking. A pack year is smoking an average of one pack of cigarettes a day for 1 year.  Yearly screening should continue until it has been 15 years since you quit.  Yearly screening should stop if you develop a health problem that would prevent you from having lung cancer treatment.  Breast Cancer  Practice breast self-awareness. This means understanding how your breasts normally appear and feel.  It also means doing regular breast self-exams. Let your health care provider know about any changes, no matter how small.  If you are in your 20s or 30s, you should have a clinical breast exam (CBE) by a health care provider every 1-3 years as part of a regular health exam.  If you are 68 or older, have a CBE every year. Also consider having a breast X-ray (mammogram) every year.  If you have a family history of breast cancer, talk to your health care provider about genetic screening.  If you are at high risk for breast cancer, talk to your health care provider about having an MRI and a mammogram every year.  Breast cancer gene (BRCA) assessment is recommended for women who have family members with BRCA-related cancers. BRCA-related cancers include: ? Breast. ? Ovarian. ? Tubal. ? Peritoneal cancers.  Results of the assessment will determine the need for genetic counseling and BRCA1 and BRCA2 testing.  Cervical Cancer Your health care provider may recommend that you be screened regularly for cancer of the pelvic organs (ovaries, uterus, and vagina). This screening involves a pelvic examination, including checking for microscopic changes to the  surface of your cervix (Pap test). You may be encouraged to have this screening done every 3 years, beginning at age 72.  For women ages 68-65, health care providers may recommend pelvic exams and Pap testing every 3 years, or they may recommend the Pap and pelvic exam, combined with testing for human papilloma virus (HPV), every 5 years. Some types of HPV increase your risk of cervical cancer. Testing for HPV may also be done on women of any age with unclear Pap test results.  Other health care providers may not recommend any screening for nonpregnant women who are considered low risk for pelvic cancer and who do not have symptoms. Ask your health care provider if a screening pelvic exam is right for you.  If you have had past treatment for cervical cancer or a condition that could lead to cancer, you need Pap tests and screening for cancer for at least 20 years after your treatment. If Pap tests have been discontinued, your risk factors (such as having a new sexual partner) need to be reassessed to determine if screening should  resume. Some women have medical problems that increase the chance of getting cervical cancer. In these cases, your health care provider may recommend more frequent screening and Pap tests.  Colorectal Cancer  This type of cancer can be detected and often prevented.  Routine colorectal cancer screening usually begins at 73 years of age and continues through 72 years of age.  Your health care provider may recommend screening at an earlier age if you have risk factors for colon cancer.  Your health care provider may also recommend using home test kits to check for hidden blood in the stool.  A small camera at the end of a tube can be used to examine your colon directly (sigmoidoscopy or colonoscopy). This is done to check for the earliest forms of colorectal cancer.  Routine screening usually begins at age 44.  Direct examination of the colon should be repeated every 5-10  years through 73 years of age. However, you may need to be screened more often if early forms of precancerous polyps or small growths are found.  Skin Cancer  Check your skin from head to toe regularly.  Tell your health care provider about any new moles or changes in moles, especially if there is a change in a mole's shape or color.  Also tell your health care provider if you have a mole that is larger than the size of a pencil eraser.  Always use sunscreen. Apply sunscreen liberally and repeatedly throughout the day.  Protect yourself by wearing long sleeves, pants, a wide-brimmed hat, and sunglasses whenever you are outside.  Heart disease, diabetes, and high blood pressure  High blood pressure causes heart disease and increases the risk of stroke. High blood pressure is more likely to develop in: ? People who have blood pressure in the high end of the normal range (130-139/85-89 mm Hg). ? People who are overweight or obese. ? People who are African American.  If you are 29-21 years of age, have your blood pressure checked every 3-5 years. If you are 45 years of age or older, have your blood pressure checked every year. You should have your blood pressure measured twice-once when you are at a hospital or clinic, and once when you are not at a hospital or clinic. Record the average of the two measurements. To check your blood pressure when you are not at a hospital or clinic, you can use: ? An automated blood pressure machine at a pharmacy. ? A home blood pressure monitor.  If you are between 1 years and 33 years old, ask your health care provider if you should take aspirin to prevent strokes.  Have regular diabetes screenings. This involves taking a blood sample to check your fasting blood sugar level. ? If you are at a normal weight and have a low risk for diabetes, have this test once every three years after 73 years of age. ? If you are overweight and have a high risk for  diabetes, consider being tested at a younger age or more often. Preventing infection Hepatitis B  If you have a higher risk for hepatitis B, you should be screened for this virus. You are considered at high risk for hepatitis B if: ? You were born in a country where hepatitis B is common. Ask your health care provider which countries are considered high risk. ? Your parents were born in a high-risk country, and you have not been immunized against hepatitis B (hepatitis B vaccine). ? You have HIV or  AIDS. ? You use needles to inject street drugs. ? You live with someone who has hepatitis B. ? You have had sex with someone who has hepatitis B. ? You get hemodialysis treatment. ? You take certain medicines for conditions, including cancer, organ transplantation, and autoimmune conditions.  Hepatitis C  Blood testing is recommended for: ? Everyone born from [birth year] through 1966. ? Anyone with known risk factors for hepatitis C.  Sexually transmitted infections (STIs)  You should be screened for sexually transmitted infections (STIs) including gonorrhea and chlamydia if: ? You are sexually active and are younger than 73 years of age. ? You are older than 73 years of age and your health care provider tells you that you are at risk for this type of infection. ? Your sexual activity has changed since you were last screened and you are at an increased risk for chlamydia or gonorrhea. Ask your health care provider if you are at risk.  If you do not have HIV, but are at risk, it may be recommended that you take a prescription medicine daily to prevent HIV infection. This is called pre-exposure prophylaxis (PrEP). You are considered at risk if: ? You are sexually active and do not regularly use condoms or know the HIV status of your partner(s). ? You take drugs by injection. ? You are sexually active with a partner who has HIV.  Talk with your health care provider about whether you are at high risk  of being infected with HIV. If you choose to begin PrEP, you should first be tested for HIV. You should then be tested every 3 months for as long as you are taking PrEP. Pregnancy  If you are premenopausal and you may become pregnant, ask your health care provider about preconception counseling.  If you may become pregnant, take 400 to 800 micrograms (mcg) of folic acid every day.  If you want to prevent pregnancy, talk to your health care provider about birth control (contraception). Osteoporosis and menopause  Osteoporosis is a disease in which the bones lose minerals and strength with aging. This can result in serious bone fractures. Your risk for osteoporosis can be identified using a bone density scan.  If you are 41 years of age or older, or if you are at risk for osteoporosis and fractures, ask your health care provider if you should be screened.  Ask your health care provider whether you should take a calcium or vitamin D supplement to lower your risk for osteoporosis.  Menopause may have certain physical symptoms and risks.  Hormone replacement therapy may reduce some of these symptoms and risks. Talk to your health care provider about whether hormone replacement therapy is right for you. Follow these instructions at home:  Schedule regular health, dental, and eye exams.  Stay current with your immunizations.  Do not use any tobacco products including cigarettes, chewing tobacco, or electronic cigarettes.  If you are pregnant, do not drink alcohol.  If you are breastfeeding, limit how much and how often you drink alcohol.  Limit alcohol intake to no more than 1 drink per day for nonpregnant women. One drink equals 12 ounces of beer, 5 ounces of wine, or 1 ounces of hard liquor.  Do not use street drugs.  Do not share needles.  Ask your health care provider for help if you need support or information about quitting drugs.  Tell your health care provider if you often  feel depressed.  Tell your health care provider if  you have ever been abused or do not feel safe at home. This information is not intended to replace advice given to you by your health care provider. Make sure you discuss any questions you have with your health care provider. Document Released: 12/03/2010 Document Revised: 10/26/2015 Document Reviewed: 02/21/2015 Elsevier Interactive Patient Education  2018 [place]. Panosh M.D.

## 2017-12-03 ENCOUNTER — Encounter: Payer: Self-pay | Admitting: Internal Medicine

## 2017-12-03 ENCOUNTER — Ambulatory Visit (INDEPENDENT_AMBULATORY_CARE_PROVIDER_SITE_OTHER): Payer: Medicare Other | Admitting: Internal Medicine

## 2017-12-03 VITALS — BP 141/82 | HR 85 | Temp 98.2°F | Ht 63.5 in | Wt 145.0 lb

## 2017-12-03 DIAGNOSIS — R739 Hyperglycemia, unspecified: Secondary | ICD-10-CM

## 2017-12-03 DIAGNOSIS — Z2821 Immunization not carried out because of patient refusal: Secondary | ICD-10-CM | POA: Diagnosis not present

## 2017-12-03 DIAGNOSIS — M1712 Unilateral primary osteoarthritis, left knee: Secondary | ICD-10-CM

## 2017-12-03 DIAGNOSIS — Z0000 Encounter for general adult medical examination without abnormal findings: Secondary | ICD-10-CM

## 2017-12-03 DIAGNOSIS — Z1159 Encounter for screening for other viral diseases: Secondary | ICD-10-CM | POA: Diagnosis not present

## 2017-12-03 DIAGNOSIS — Z79899 Other long term (current) drug therapy: Secondary | ICD-10-CM | POA: Diagnosis not present

## 2017-12-03 DIAGNOSIS — E785 Hyperlipidemia, unspecified: Secondary | ICD-10-CM | POA: Diagnosis not present

## 2017-12-03 LAB — BASIC METABOLIC PANEL
BUN: 11 mg/dL (ref 6–23)
CALCIUM: 9.9 mg/dL (ref 8.4–10.5)
CO2: 29 meq/L (ref 19–32)
CREATININE: 0.69 mg/dL (ref 0.40–1.20)
Chloride: 101 mEq/L (ref 96–112)
GFR: 107.12 mL/min (ref >60.00)
Glucose, Bld: 100 mg/dL — ABNORMAL HIGH (ref 70–99)
Potassium: 4.7 mEq/L (ref 3.5–5.1)
Sodium: 138 mEq/L (ref 135–145)

## 2017-12-03 LAB — LIPID PANEL
CHOL/HDL RATIO: 3
Cholesterol: 237 mg/dL — ABNORMAL HIGH (ref 0–200)
HDL: 87.8 mg/dL (ref >39.00)
LDL Cholesterol: 130 mg/dL — ABNORMAL HIGH (ref 0–99)
NONHDL: 149.27
Triglycerides: 98 mg/dL (ref 0.0–149.0)
VLDL: 19.6 mg/dL (ref 0.0–40.0)

## 2017-12-03 NOTE — Patient Instructions (Addendum)
Continue lifestyle intervention healthy eating and exercise . Make sure BP readings are below   140/90   Take blood pressure readings twice a day for 7- 10 days and then periodically .To ensure below 140/90   .   Let us know if not controlled .  Shingrix. vaccineat your pharmacy     DASH Eating Plan DASH stands for "Dietary Approaches to Stop Hypertension." The DASH eating plan is a healthy eating plan that has been shown to reduce high blood pressure (hypertension). It may also reduce your risk for type 2 diabetes, heart disease, and stroke. The DASH eating plan may also help with weight loss. What are tips for following this plan? General guidelines  Avoid eating more than 2,300 mg (milligrams) of salt (sodium) a day. If you have hypertension, you may need to reduce your sodium intake to 1,500 mg a day.  Limit alcohol intake to no more than 1 drink a day for nonpregnant women and 2 drinks a day for men. One drink equals 12 oz of beer, 5 oz of wine, or 1 oz of hard liquor.  Work with your health care provider to maintain a healthy body weight or to lose weight. Ask what an ideal weight is for you.  Get at least 30 minutes of exercise that causes your heart to beat faster (aerobic exercise) most days of the week. Activities may include walking, swimming, or biking.  Work with your health care provider or diet and nutrition specialist (dietitian) to adjust your eating plan to your individual calorie needs. Reading food labels  Check food labels for the amount of sodium per serving. Choose foods with less than 5 percent of the Daily Value of sodium. Generally, foods with less than 300 mg of sodium per serving fit into this eating plan.  To find whole grains, look for the word "whole" as the first word in the ingredient list. Shopping  Buy products labeled as "low-sodium" or "no salt added."  Buy fresh foods. Avoid canned foods and premade or frozen meals. Cooking  Avoid adding  salt when cooking. Use salt-free seasonings or herbs instead of table salt or sea salt. Check with your health care provider or pharmacist before using salt substitutes.  Do not fry foods. Cook foods using healthy methods such as baking, boiling, grilling, and broiling instead.  Cook with heart-healthy oils, such as olive, canola, soybean, or sunflower oil. Meal planning   Eat a balanced diet that includes: ? 5 or more servings of fruits and vegetables each day. At each meal, try to fill half of your plate with fruits and vegetables. ? Up to 6-8 servings of whole grains each day. ? Less than 6 oz of lean meat, poultry, or fish each day. A 3-oz serving of meat is about the same size as a deck of cards. One egg equals 1 oz. ? 2 servings of low-fat dairy each day. ? A serving of nuts, seeds, or beans 5 times each week. ? Heart-healthy fats. Healthy fats called Omega-3 fatty acids are found in foods such as flaxseeds and coldwater fish, like sardines, salmon, and mackerel.  Limit how much you eat of the following: ? Canned or prepackaged foods. ? Food that is high in trans fat, such as fried foods. ? Food that is high in saturated fat, such as fatty meat. ? Sweets, desserts, sugary drinks, and other foods with added sugar. ? Full-fat dairy products.  Do not salt foods before eating.  Try to eat  at least 2 vegetarian meals each week.  Eat more home-cooked food and less restaurant, buffet, and fast food.  When eating at a restaurant, ask that your food be prepared with less salt or no salt, if possible. What foods are recommended? The items listed may not be a complete list. Talk with your dietitian about what dietary choices are best for you. Grains Whole-grain or whole-wheat bread. Whole-grain or whole-wheat pasta. Brown rice. [place]. Bulgur. Whole-grain and low-sodium cereals. Pita bread. Low-fat, low-sodium crackers. Whole-wheat flour tortillas. Vegetables Fresh or frozen  vegetables (raw, steamed, roasted, or grilled). Low-sodium or reduced-sodium tomato and vegetable juice. Low-sodium or reduced-sodium tomato sauce and tomato paste. Low-sodium or reduced-sodium canned vegetables. Fruits All fresh, dried, or frozen fruit. Canned fruit in natural juice (without added sugar). Meat and other protein foods Skinless chicken or [place]. Ground chicken or [place]. Pork with fat trimmed off. Fish and seafood. Egg whites. Dried beans, peas, or lentils. Unsalted nuts, nut butters, and seeds. Unsalted canned beans. Lean cuts of beef with fat trimmed off. Low-sodium, lean deli meat. Dairy Low-fat (1%) or fat-free (skim) milk. Fat-free, low-fat, or reduced-fat cheeses. Nonfat, low-sodium ricotta or cottage cheese. Low-fat or nonfat yogurt. Low-fat, low-sodium cheese. Fats and oils Soft margarine without trans fats. Vegetable oil. Low-fat, reduced-fat, or light mayonnaise and salad dressings (reduced-sodium). Canola, safflower, olive, soybean, and sunflower oils. Avocado. Seasoning and other foods Herbs. Spices. Seasoning mixes without salt. Unsalted popcorn and pretzels. Fat-free sweets. What foods are not recommended? The items listed may not be a complete list. Talk with your dietitian about what dietary choices are best for you. Grains Baked goods made with fat, such as croissants, muffins, or some breads. Dry pasta or rice meal packs. Vegetables Creamed or fried vegetables. Vegetables in a cheese sauce. Regular canned vegetables (not low-sodium or reduced-sodium). Regular canned tomato sauce and paste (not low-sodium or reduced-sodium). Regular tomato and vegetable juice (not low-sodium or reduced-sodium). Angie Fava. Olives. Fruits Canned fruit in a light or heavy syrup. Fried fruit. Fruit in cream or butter sauce. Meat and other protein foods Fatty cuts of meat. Ribs. Fried meat. Berniece Salines. Sausage. Bologna and other processed lunch meats. Salami. Fatback. Hotdogs. Bratwurst.  Salted nuts and seeds. Canned beans with added salt. Canned or smoked fish. Whole eggs or egg yolks. Chicken or [place] with skin. Dairy Whole or 2% milk, cream, and half-and-half. Whole or full-fat cream cheese. Whole-fat or sweetened yogurt. Full-fat cheese. Nondairy creamers. Whipped toppings. Processed cheese and cheese spreads. Fats and oils Butter. Stick margarine. Lard. Shortening. Ghee. Bacon fat. Tropical oils, such as coconut, palm kernel, or palm oil. Seasoning and other foods Salted popcorn and pretzels. Onion salt, garlic salt, seasoned salt, table salt, and sea salt. Worcestershire sauce. Tartar sauce. Barbecue sauce. Teriyaki sauce. Soy sauce, including reduced-sodium. Steak sauce. Canned and packaged gravies. Fish sauce. Oyster sauce. Cocktail sauce. Horseradish that you find on the shelf. Ketchup. Mustard. Meat flavorings and tenderizers. Bouillon cubes. Hot sauce and Tabasco sauce. Premade or packaged marinades. Premade or packaged taco seasonings. Relishes. Regular salad dressings. Where to find more information:  National Heart, Lung, and [place]: https://wilson-eaton.com/  American Heart Association: www.heart.org Summary  The DASH eating plan is a healthy eating plan that has been shown to reduce high blood pressure (hypertension). It may also reduce your risk for type 2 diabetes, heart disease, and stroke.  With the DASH eating plan, you should limit salt (sodium) intake to 2,300 mg a day. If you have hypertension,  you may need to reduce your sodium intake to 1,500 mg a day.  When on the DASH eating plan, aim to eat more fresh fruits and vegetables, whole grains, lean proteins, low-fat dairy, and heart-healthy fats.  Work with your health care provider or diet and nutrition specialist (dietitian) to adjust your eating plan to your individual calorie needs. This information is not intended to replace advice given to you by your health care provider. Make sure you discuss any  questions you have with your health care provider. Document Released: 05/09/2011 Document Revised: 05/13/2016 Document Reviewed: 05/13/2016 Elsevier Interactive Patient Education  2018 [place] Maintenance, Female Adopting a healthy lifestyle and getting preventive care can go a long way to promote health and wellness. Talk with your health care provider about what schedule of regular examinations is right for you. This is a good chance for you to check in with your provider about disease prevention and staying healthy. In between checkups, there are plenty of things you can do on your own. Experts have done a lot of research about which lifestyle changes and preventive measures are most likely to keep you healthy. Ask your health care provider for more information. Weight and diet Eat a healthy diet  Be sure to include plenty of vegetables, fruits, low-fat dairy products, and lean protein.  Do not eat a lot of foods high in solid fats, added sugars, or salt.  Get regular exercise. This is one of the most important things you can do for your health. ? Most adults should exercise for at least 150 minutes each week. The exercise should increase your heart rate and make you sweat (moderate-intensity exercise). ? Most adults should also do strengthening exercises at least twice a week. This is in addition to the moderate-intensity exercise.  Maintain a healthy weight  Body mass index (BMI) is a measurement that can be used to identify possible weight problems. It estimates body fat based on height and weight. Your health care provider can help determine your BMI and help you achieve or maintain a healthy weight.  For females 33 years of age and older: ? A BMI below 18.5 is considered underweight. ? A BMI of 18.5 to 24.9 is normal. ? A BMI of 25 to 29.9 is considered overweight. ? A BMI of 30 and above is considered obese.  Watch levels of cholesterol and blood lipids  You  should start having your blood tested for lipids and cholesterol at 73 years of age, then have this test every 5 years.  You may need to have your cholesterol levels checked more often if: ? Your lipid or cholesterol levels are high. ? You are older than 73 years of age. ? You are at high risk for heart disease.  Cancer screening Lung Cancer  Lung cancer screening is recommended for adults 70-73 years old who are at high risk for lung cancer because of a history of smoking.  A yearly low-dose CT scan of the lungs is recommended for people who: ? Currently smoke. ? Have quit within the past 15 years. ? Have at least a 30-pack-year history of smoking. A pack year is smoking an average of one pack of cigarettes a day for 1 year.  Yearly screening should continue until it has been 15 years since you quit.  Yearly screening should stop if you develop a health problem that would prevent you from having lung cancer treatment.  Breast Cancer  Practice breast self-awareness.  This means understanding how your breasts normally appear and feel.  It also means doing regular breast self-exams. Let your health care provider know about any changes, no matter how small.  If you are in your 20s or 30s, you should have a clinical breast exam (CBE) by a health care provider every 1-3 years as part of a regular health exam.  If you are 1 or older, have a CBE every year. Also consider having a breast X-ray (mammogram) every year.  If you have a family history of breast cancer, talk to your health care provider about genetic screening.  If you are at high risk for breast cancer, talk to your health care provider about having an MRI and a mammogram every year.  Breast cancer gene (BRCA) assessment is recommended for women who have family members with BRCA-related cancers. BRCA-related cancers include: ? Breast. ? Ovarian. ? Tubal. ? Peritoneal cancers.  Results of the assessment will determine the  need for genetic counseling and BRCA1 and BRCA2 testing.  Cervical Cancer Your health care provider may recommend that you be screened regularly for cancer of the pelvic organs (ovaries, uterus, and vagina). This screening involves a pelvic examination, including checking for microscopic changes to the surface of your cervix (Pap test). You may be encouraged to have this screening done every 3 years, beginning at age 77.  For women ages 56-65, health care providers may recommend pelvic exams and Pap testing every 3 years, or they may recommend the Pap and pelvic exam, combined with testing for human papilloma virus (HPV), every 5 years. Some types of HPV increase your risk of cervical cancer. Testing for HPV may also be done on women of any age with unclear Pap test results.  Other health care providers may not recommend any screening for nonpregnant women who are considered low risk for pelvic cancer and who do not have symptoms. Ask your health care provider if a screening pelvic exam is right for you.  If you have had past treatment for cervical cancer or a condition that could lead to cancer, you need Pap tests and screening for cancer for at least 20 years after your treatment. If Pap tests have been discontinued, your risk factors (such as having a new sexual partner) need to be reassessed to determine if screening should resume. Some women have medical problems that increase the chance of getting cervical cancer. In these cases, your health care provider may recommend more frequent screening and Pap tests.  Colorectal Cancer  This type of cancer can be detected and often prevented.  Routine colorectal cancer screening usually begins at 73 years of age and continues through 73 years of age.  Your health care provider may recommend screening at an earlier age if you have risk factors for colon cancer.  Your health care provider may also recommend using home test kits to check for hidden blood  in the stool.  A small camera at the end of a tube can be used to examine your colon directly (sigmoidoscopy or colonoscopy). This is done to check for the earliest forms of colorectal cancer.  Routine screening usually begins at age 56.  Direct examination of the colon should be repeated every 5-10 years through 73 years of age. However, you may need to be screened more often if early forms of precancerous polyps or small growths are found.  Skin Cancer  Check your skin from head to toe regularly.  Tell your health care provider about any  new moles or changes in moles, especially if there is a change in a mole's shape or color.  Also tell your health care provider if you have a mole that is larger than the size of a pencil eraser.  Always use sunscreen. Apply sunscreen liberally and repeatedly throughout the day.  Protect yourself by wearing long sleeves, pants, a wide-brimmed hat, and sunglasses whenever you are outside.  Heart disease, diabetes, and high blood pressure  High blood pressure causes heart disease and increases the risk of stroke. High blood pressure is more likely to develop in: ? People who have blood pressure in the high end of the normal range (130-139/85-89 mm Hg). ? People who are overweight or obese. ? People who are African American.  If you are 49-6 years of age, have your blood pressure checked every 3-5 years. If you are 64 years of age or older, have your blood pressure checked every year. You should have your blood pressure measured twice-once when you are at a hospital or clinic, and once when you are not at a hospital or clinic. Record the average of the two measurements. To check your blood pressure when you are not at a hospital or clinic, you can use: ? An automated blood pressure machine at a pharmacy. ? A home blood pressure monitor.  If you are between 40 years and 54 years old, ask your health care provider if you should take aspirin to prevent  strokes.  Have regular diabetes screenings. This involves taking a blood sample to check your fasting blood sugar level. ? If you are at a normal weight and have a low risk for diabetes, have this test once every three years after 73 years of age. ? If you are overweight and have a high risk for diabetes, consider being tested at a younger age or more often. Preventing infection Hepatitis B  If you have a higher risk for hepatitis B, you should be screened for this virus. You are considered at high risk for hepatitis B if: ? You were born in a country where hepatitis B is common. Ask your health care provider which countries are considered high risk. ? Your parents were born in a high-risk country, and you have not been immunized against hepatitis B (hepatitis B vaccine). ? You have HIV or AIDS. ? You use needles to inject street drugs. ? You live with someone who has hepatitis B. ? You have had sex with someone who has hepatitis B. ? You get hemodialysis treatment. ? You take certain medicines for conditions, including cancer, organ transplantation, and autoimmune conditions.  Hepatitis C  Blood testing is recommended for: ? Everyone born from [birth year] through 1966. ? Anyone with known risk factors for hepatitis C.  Sexually transmitted infections (STIs)  You should be screened for sexually transmitted infections (STIs) including gonorrhea and chlamydia if: ? You are sexually active and are younger than 73 years of age. ? You are older than 73 years of age and your health care provider tells you that you are at risk for this type of infection. ? Your sexual activity has changed since you were last screened and you are at an increased risk for chlamydia or gonorrhea. Ask your health care provider if you are at risk.  If you do not have HIV, but are at risk, it may be recommended that you take a prescription medicine daily to prevent HIV infection. This is called pre-exposure prophylaxis  (PrEP). You are considered at  risk if: ? You are sexually active and do not regularly use condoms or know the HIV status of your partner(s). ? You take drugs by injection. ? You are sexually active with a partner who has HIV.  Talk with your health care provider about whether you are at high risk of being infected with HIV. If you choose to begin PrEP, you should first be tested for HIV. You should then be tested every 3 months for as long as you are taking PrEP. Pregnancy  If you are premenopausal and you may become pregnant, ask your health care provider about preconception counseling.  If you may become pregnant, take 400 to 800 micrograms (mcg) of folic acid every day.  If you want to prevent pregnancy, talk to your health care provider about birth control (contraception). Osteoporosis and menopause  Osteoporosis is a disease in which the bones lose minerals and strength with aging. This can result in serious bone fractures. Your risk for osteoporosis can be identified using a bone density scan.  If you are 26 years of age or older, or if you are at risk for osteoporosis and fractures, ask your health care provider if you should be screened.  Ask your health care provider whether you should take a calcium or vitamin D supplement to lower your risk for osteoporosis.  Menopause may have certain physical symptoms and risks.  Hormone replacement therapy may reduce some of these symptoms and risks. Talk to your health care provider about whether hormone replacement therapy is right for you. Follow these instructions at home:  Schedule regular health, dental, and eye exams.  Stay current with your immunizations.  Do not use any tobacco products including cigarettes, chewing tobacco, or electronic cigarettes.  If you are pregnant, do not drink alcohol.  If you are breastfeeding, limit how much and how often you drink alcohol.  Limit alcohol intake to no more than 1 drink per day for  nonpregnant women. One drink equals 12 ounces of beer, 5 ounces of wine, or 1 ounces of hard liquor.  Do not use street drugs.  Do not share needles.  Ask your health care provider for help if you need support or information about quitting drugs.  Tell your health care provider if you often feel depressed.  Tell your health care provider if you have ever been abused or do not feel safe at home. This information is not intended to replace advice given to you by your health care provider. Make sure you discuss any questions you have with your health care provider. Document Released: 12/03/2010 Document Revised: 10/26/2015 Document Reviewed: 02/21/2015 Elsevier Interactive Patient Education  [employer].

## 2017-12-04 LAB — HEPATITIS C ANTIBODY
HEP C AB: NONREACTIVE
SIGNAL TO CUT-OFF: 0.02 (ref <1.00)

## 2017-12-10 ENCOUNTER — Other Ambulatory Visit: Payer: Self-pay | Admitting: Family Medicine

## 2017-12-10 MED ORDER — IBUPROFEN 800 MG PO TABS: 800.0000 mg | ORAL_TABLET | Freq: Three times a day (TID) | ORAL | 1 refills | Status: DC | PRN

## 2017-12-19 ENCOUNTER — Other Ambulatory Visit: Payer: Self-pay | Admitting: Family Medicine

## 2017-12-19 DIAGNOSIS — L509 Urticaria, unspecified: Secondary | ICD-10-CM

## 2018-01-05 ENCOUNTER — Ambulatory Visit: Payer: Medicare Other

## 2018-01-20 ENCOUNTER — Encounter: Payer: Self-pay | Admitting: Internal Medicine

## 2018-04-08 DIAGNOSIS — H5203 Hypermetropia, bilateral: Secondary | ICD-10-CM | POA: Diagnosis not present

## 2018-04-08 DIAGNOSIS — H524 Presbyopia: Secondary | ICD-10-CM | POA: Diagnosis not present

## 2018-04-08 DIAGNOSIS — H04123 Dry eye syndrome of bilateral lacrimal glands: Secondary | ICD-10-CM | POA: Diagnosis not present

## 2018-04-08 DIAGNOSIS — H52223 Regular astigmatism, bilateral: Secondary | ICD-10-CM | POA: Diagnosis not present

## 2018-04-11 DIAGNOSIS — M1711 Unilateral primary osteoarthritis, right knee: Secondary | ICD-10-CM | POA: Diagnosis not present

## 2018-04-11 DIAGNOSIS — M1712 Unilateral primary osteoarthritis, left knee: Secondary | ICD-10-CM | POA: Diagnosis not present

## 2018-04-18 DIAGNOSIS — M25561 Pain in right knee: Secondary | ICD-10-CM | POA: Diagnosis not present

## 2018-04-28 DIAGNOSIS — M1711 Unilateral primary osteoarthritis, right knee: Secondary | ICD-10-CM | POA: Diagnosis not present

## 2018-05-25 DIAGNOSIS — M1712 Unilateral primary osteoarthritis, left knee: Secondary | ICD-10-CM | POA: Diagnosis not present

## 2018-05-25 DIAGNOSIS — M1711 Unilateral primary osteoarthritis, right knee: Secondary | ICD-10-CM | POA: Diagnosis not present

## 2018-07-10 ENCOUNTER — Telehealth: Payer: Self-pay

## 2018-07-10 ENCOUNTER — Encounter: Payer: Self-pay | Admitting: Internal Medicine

## 2018-07-10 ENCOUNTER — Ambulatory Visit (INDEPENDENT_AMBULATORY_CARE_PROVIDER_SITE_OTHER): Payer: Medicare Other | Admitting: Internal Medicine

## 2018-07-10 VITALS — BP 136/84 | HR 100 | Temp 98.3°F | Wt 146.0 lb

## 2018-07-10 DIAGNOSIS — R42 Dizziness and giddiness: Secondary | ICD-10-CM

## 2018-07-10 DIAGNOSIS — R0989 Other specified symptoms and signs involving the circulatory and respiratory systems: Secondary | ICD-10-CM | POA: Diagnosis not present

## 2018-07-10 MED ORDER — IBUPROFEN 800 MG PO TABS: 800.0000 mg | ORAL_TABLET | Freq: Three times a day (TID) | ORAL | 1 refills | Status: DC | PRN

## 2018-07-10 NOTE — Patient Instructions (Addendum)
Your exam today is good . Take blood pressure readings twice a day for 7- 10 days and then periodically .To ensure below 140/90   .Send in readings      Goal is below 140/90 or less .  If getting  Loss of vision fainting numbness that you cant explain .  Need  immediate care  Or recheck. Think the eye findings is not serious but if  returning  Let us know .  Bring your  Blood  Pressure  monitor to your next visit .

## 2018-07-10 NOTE — Telephone Encounter (Signed)
Author phoned pt. to offer to schedule initial AWV, along with CPE with Dr. Fabian Sharp for after July 3rd. Author explained difference between awv with nurse vs. Physical with provider as pt. Was confused, and pt. Stated she could not schedule either at this time but would call back to schedule both.

## 2018-07-10 NOTE — Progress Notes (Signed)
Chief Complaint  Patient presents with  . Hypertension    Pt states that BP was high yesterday and states that she has dizziness ,slight headache,had redness in left eye states when she bought a bp reading at home and started to go down     HPI: Whitney Payne 73 y.o. come in for sda   BP up and down   See above   Was  Low  On wrist when got up in the morning was slightly dizzy and then noticed later she had a red eye that lasted only a day on the left without associated pain or change in vision. Her wrist blood pressure showed about 106 and she got a "real monitor" around the wrist and her blood pressure readings have been in the 130/80 range. Otherwise she feels quite well but was just worried about the redness in her eye and her blood pressure being low Has not had any bleeding chest pain shortness of breath physical limitations on activity. And was idizzy and then eye red.      ROS: See pertinent positives and negatives per HPI.  She has had a work-up for vertigo and dizziness in the past without any kind of major cardiovascular neurologic finding.  Past Medical History:  Diagnosis Date  . Abnormal EKG    nonspecific ST and T-wave changes  . Headache(784.0)   . Knee problem   . Postmenopausal HRT (hormone replacement therapy)   . Recurrent vertigo    neuro eval see mri report    Family History  Problem Relation Age of Onset  . Other Mother        died of se of patch narcotic, had artery diesease and amputation  . Other Father        collapsed lung- smoker    Social History   Socioeconomic History  . Marital status: Legally Separated    Spouse name: Not on file  . Number of children: Not on file  . Years of education: Not on file  . Highest education level: Not on file  Occupational History  . Not on file  Social Needs  . Financial resource strain: Not on file  . Food insecurity:    Worry: Not on file    Inability: Not on file  . Transportation needs:   Medical: Not on file    Non-medical: Not on file  Tobacco Use  . Smoking status: Former Smoker    Last attempt to quit: 10/14/1963    Years since quitting: 54.7  . Smokeless tobacco: Never Used  Substance and Sexual Activity  . Alcohol use: Yes    Comment: occasionally  . Drug use: No  . Sexual activity: Not on file  Lifestyle  . Physical activity:    Days per week: Not on file    Minutes per session: Not on file  . Stress: Not on file  Relationships  . Social connections:    Talks on phone: Not on file    Gets together: Not on file    Attends religious service: Not on file    Active member of club or organization: Not on file    Attends meetings of clubs or organizations: Not on file    Relationship status: Not on file  Other Topics Concern  . 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 Calcium Carbonate-Vitamin D (CALCIUM + D PO) Take by mouth.      . cetirizine (ZYRTEC) 10 MG tablet TAKE 1 TABLET BY MOUTH EVERY DAY 30 tablet 11  . vitamin B-12 (CYANOCOBALAMIN) 100 MCG tablet Take 100 mcg by mouth daily.      Marland Kitchen. ibuprofen (ADVIL,MOTRIN) 800 MG tablet Take 1 tablet (800 mg total) by mouth 3 (three) times daily as needed. 30 tablet 1   No facility-administered medications prior to visit.      EXAM:  BP 136/84 (BP Location: Right Arm, Patient Position: Sitting, Cuff Size: Normal)   Pulse 100   Temp 98.3 F (36.8 C) (Oral)   Wt 146 lb (66.2 kg)   BMI 25.46 kg/m   Body mass index is 25.46 kg/m. Blood pressure retaken sitting both arms right originally 158/80 on repeat 138/70 left arm regular cuff 136/72 GENERAL: vitals reviewed and listed above, alert, oriented, appears well hydrated and in no acute distress HEENT: atraumatic, conjunctiva  clear, no obvious abnormalities on inspection of external nose  and ears OP : no lesion edema or exudate  NECK: no obvious masses on inspection palpation  LUNGS: clear to auscultation bilaterally, no wheezes, rales or rhonchi, good air movement CV: HRRR, no clubbing cyanosis or  peripheral edema nl cap refill  MS: moves all extremities without noticeable focal  abnormality PSYCH: pleasant and cooperative, no obvious depression or anxiety Neuro non focal  Lab Results  Component Value Date   WBC 5.2 11/13/2016   HGB 12.3 11/13/2016   HCT 37.8 11/13/2016   PLT 331.0 11/13/2016   GLUCOSE 100 (H) 12/03/2017   CHOL 237 (H) 12/03/2017   TRIG 98.0 12/03/2017   HDL 87.80 12/03/2017   LDLCALC 130 (H) 12/03/2017   ALT 13 11/13/2016   AST 15 11/13/2016   NA 138 12/03/2017   K 4.7 12/03/2017   CL 101 12/03/2017   CREATININE 0.69 12/03/2017   BUN 11 12/03/2017   CO2 29 12/03/2017   TSH 0.39 11/13/2016   HGBA1C 6.4 11/13/2016   BP Readings from Last 3 Encounters:  07/10/18 136/84  12/03/17 (!) 141/82  12/14/16 130/80   Wt Readings from Last 3 Encounters:  07/10/18 146 lb (66.2 kg)  12/03/17 145 lb (65.8 kg)  12/14/16 145 lb 12 oz (66.1 kg)     ASSESSMENT AND PLAN:  Discussed the following assessment and plan:  Labile blood pressure  Dizziness and giddiness resolved  Had episode of lightheadedness dizzy and a red eye but is looks perfectly fine now and has had a work-up for vertigo in the past. Get adequate readings she is aware no evidence of hypovolemia at this time if her baseline blood pressures creeping up above goal in the 140/90 range would add low-dose medication.  However on repeat blood pressures she seems to be in range and want to avoid hypotension at this time. Recheck before next visit if needed repeat regarding her blood pressure readings.  Total visit 25mins > 50% spent counseling and coordinating care as indicated in above note and in instructions to patient .  -Patient advised to return or notify health care team  if  new  concerns arise.  Patient Instructions  Your exam today is good . Take blood pressure readings twice a day for 7- 10 days and then periodically .To ensure below 140/90   .Send in readings      Goal is below 140/90 or  less .  If getting  Loss of vision fainting numbness that you cant explain .  Need  immediate care  Or recheck. Think the eye findings is not serious but if  returning  Let us know .  Bring your  Blood  Pressure  monitor to your next visit .     Neta MendsWanda K. [place] M.D.

## 2018-10-22 ENCOUNTER — Other Ambulatory Visit: Payer: Self-pay | Admitting: Internal Medicine

## 2018-11-16 ENCOUNTER — Telehealth: Payer: Self-pay | Admitting: Internal Medicine

## 2018-11-16 DIAGNOSIS — M25561 Pain in right knee: Secondary | ICD-10-CM | POA: Diagnosis not present

## 2018-11-16 NOTE — Telephone Encounter (Signed)
Dr.Kim does virtual annual wellness visits it is okay for Dr.Kim to do virtual annual wellness and we set her up for in office CPe on Wednesday ?please advise

## 2018-11-16 NOTE — Telephone Encounter (Signed)
The patient called in wanting to make an appointment for her CPE. Her last CPE was 12/03/2017. She would also like to have her AWV done the same day. I advised the patient that we do not have an AWV health coach and she wanted to know if she can schedule a virtual visit for the AWV. She wants to have her CPE and AWV scheduled on the same day. She can only schedule for Monday, Wednesday and Friday.   Please Advise

## 2018-11-17 NOTE — Telephone Encounter (Signed)
So unfortunately cannot  Do visit 2 providers on the same day .  Can schedule a  Combo visit  With me   On a day she can come but  Block out 3 x15 minutes slots  On a Wednesday?  I thought we were having an available health coach for the month of July  Please advise

## 2018-11-19 NOTE — Telephone Encounter (Signed)
lvm for pt to call back.  

## 2018-12-09 NOTE — Progress Notes (Signed)
Chief Complaint  Patient presents with  . Annual Exam    pt presnt for anual exam   . Edema    sx week in left leg   . Knee Pain    sx 1 year ago. R knee. Pt stated she has a hx of issues with R leg.    HPI: Whitney Payne 73 y.o. comes in today for Preventive Medicare exam and evaluation   Knee arthritis is problematic  Sees dr Luiz BlareGraves  Had been takign x tra ibuprofen recently  Left leg is puffier around ankle then right  To get checked .  Otherwise doing well may be headed for TKA   Health Maintenance  Topic Date Due  . TETANUS/TDAP  06/20/1963  . COLONOSCOPY  06/19/1994  . PNA vac Low Risk Adult (1 of 2 - PCV13) 06/19/2009  . COLON CANCER SCREENING ANNUAL FOBT  12/03/2018  . INFLUENZA VACCINE  01/02/2019  . MAMMOGRAM  01/04/2019  . DEXA SCAN  Completed  . 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 Last eye check UTD  Safety:  Has smoke detector and wears seat belts.   No excess sun exposure. Sees dentist regularly.  Falls:  no  Memory: Felt to be good  , no concern from her or her family.  Depression: No anhedonia unusual crying or depressive symptoms  Nutrition: Eats well balanced diet; adequate calcium and vitamin D. No swallowing chewing problems.  Injury: no major injuries in the last six months.  Other healthcare providers:  Reviewed today .Marland Kitchen.   Preventive parameters: up-to-date  Reviewed   ADLS:   There are no problems or need for assistance  driving, feeding, obtaining food, dressing, toileting and bathing, managing money using phone. She is independent runs own business  ROS: see above  GEN/ HEENT: No fever, significant weight changes sweats headaches vision problems hearing  changes, CV/ PULM; No chest pain shortness of breath cough, syncope,edema  change in exercise tolerance. GI /GU: No adominal pain, vomiting, change in bowel habits. No blood in the stool. No significant GU symptoms. SKIN/HEME: ,no acute skin rashes suspicious lesions or bleeding. No lymphadenopathy, nodules, masses.  NEURO/ PSYCH:  No neurologic signs such as weakness numbness. No depression anxiety. IMM/ Allergy: No unusual infections.  Allergy .   REST of 12 system review negative except as per HPI   Past Medical History:  Diagnosis Date  . Abnormal EKG    nonspecific ST and T-wave changes  . Headache(784.0)   . Knee problem   . Postmenopausal HRT (hormone replacement therapy)   . Recurrent vertigo    neuro eval see mri report    Family History  Problem Relation Age of Onset  . Other Mother        died of se of patch narcotic, had artery diesease and amputation  . Other Father        collapsed lung- smoker    Social History   Socioeconomic History  . Marital status: Legally Separated    Spouse name: Not on file  . Number of children: Not on file  . Years of education: Not on file  .  Highest education level: Not on file  Occupational History  . Not on file  Social Needs  . Financial resource strain: Not on file  . Food insecurity    Worry: Not on file    Inability: Not on file  . Transportation needs    Medical: Not on file    Non-medical: Not on file  Tobacco Use  . Smoking status: Former Smoker    Quit date: 10/14/1963    Years since quitting: 55.2  . Smokeless tobacco: Never Used  Substance and Sexual Activity  . Alcohol use: Yes    Comment: occasionally  . Drug use: No  . Sexual activity: Not on file  Lifestyle  . Physical activity    Days per week: Not on file    Minutes per session: Not on file  . Stress: Not on file  Relationships  . Social Musicianconnections    Talks on phone: Not on file    Gets together: Not on file    Attends religious service: Not  on file    Active member of club or organization: Not on file    Attends meetings of clubs or organizations: Not on file    Relationship status: Not on file  Other Topics Concern  . 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 Calcium Carbonate-Vitamin D (CALCIUM + D PO) Take by mouth.    . cetirizine (ZYRTEC) 10 MG tablet TAKE 1 TABLET BY MOUTH EVERY DAY  . ibuprofen (ADVIL) 800 MG tablet   . ibuprofen (ADVIL) 800 MG tablet Take 1 tablet (800 mg total) by mouth 3 (three) times daily as needed.  . vitamin B-12 (CYANOCOBALAMIN) 100 MCG tablet Take 100 mcg by mouth daily.    . [DISCONTINUED] ibuprofen (ADVIL) 800 MG tablet TAKE 1 TABLET BY MOUTH 3 TIMES DAILY AS NEEDED.   No facility-administered encounter medications on file as of 12/14/2018.     EXAM:  BP 132/62   Pulse 96   Temp 98.9 F (37.2 C) (Oral)   Ht 5' 2.75" (1.594 m)   Wt 146 lb (66.2 kg)   SpO2 97%   BMI 26.07 kg/m   Body mass index is 26.07 kg/m.  Physical Exam: Vital signs reviewed OZH:YQMVGEN:This is a well-developed well-nourished alert cooperative   who appears stated age in no acute distress.  HEENT: normocephalic atraumatic , Eyes: PERRL EOM's full, conjunctiva clear, Nares: paten,t no deformity discharge or tenderness., Ears: no deformity EAC's clear TMs with normal landmarks. Mouth: clear OP, masked deferred NECK: supple without masses, thyromegaly or bruits. CHEST/PULM:  Clear to auscultation and percussion breath sounds equal no wheeze , rales or rhonchi. No chest wall deformities or tenderness. CV: PMI is nondisplaced, S1 S2 no gallops, murmurs, rubs. Peripheral pulses are full without delay.No JVD .  ABDOMEN: Bowel sounds normal nontender  No guard or rebound, no hepato splenomegal no CVA tenderness.   Extremtities:  No clubbing cyanosis  Vv mild   Puffy at ankles left more than right  Mild edema no acute joint swelling or redness arthritis changes    no focal atrophy NEURO:  Oriented x3, cranial nerves 3-12 appear to be intact, no obvious focal weakness,gait within normal limits no abnormal reflexes or asymmetrical SKIN: No acute rashes normal turgor, color, no bruising  or petechiae. PSYCH: Oriented, good eye contact, no obvious depression anxiety, cognition and judgment appear normal. LN: no cervical axillary iadenopathy No noted deficits in memory, attention, and speech.   Lab Results  Component Value Date   WBC 5.2 11/13/2016   HGB 12.3 11/13/2016   HCT 37.8 11/13/2016   PLT 331.0 11/13/2016   GLUCOSE 100 (H) 12/03/2017   CHOL 237 (H) 12/03/2017   TRIG 98.0 12/03/2017   HDL 87.80 12/03/2017   LDLCALC 130 (H) 12/03/2017   ALT 13 11/13/2016   AST 15 11/13/2016   NA 138 12/03/2017   K 4.7 12/03/2017   CL 101 12/03/2017   CREATININE 0.69 12/03/2017   BUN 11 12/03/2017   CO2 29 12/03/2017   TSH 0.39 11/13/2016   HGBA1C 6.4 11/13/2016    ASSESSMENT AND PLAN:  Discussed the following assessment and plan:   ICD-10-CM   1. Visit for preventive health examination  Z00.00   2. Medication management  N39.767 Basic metabolic panel    Hemoglobin A1c    Hepatic function panel    Lipid panel    CBC with Differential/Platelet  3. Hyperlipidemia, unspecified hyperlipidemia type  H41.9 Basic metabolic panel    Hemoglobin A1c    Hepatic function panel    Lipid panel    CBC with Differential/Platelet  4. Hyperglycemia  F79.0 Basic metabolic panel    Hemoglobin A1c    Hepatic function panel    Lipid panel    CBC with Differential/Platelet  5. Leg swelling  W40.97 Basic metabolic panel    Hemoglobin A1c    Hepatic function panel    Lipid panel    CBC with Differential/Platelet   left more than right doubt obstruction follow poss ven insufficiency plus other factores  6. Screen for colon cancer  Z12.11 Fecal occult blood,  imunochemical   Colon screen prefers the  Stool cards    In lieu of other parameters is utd on mammogram   Suspect the edema is from combo venous insuff gravity warm weather and ibuprofen.   Doubt obstructive phenom   Limit nsaids as possible  Compression  Lab today  And fu if  Unilateral pain swelling  Other   Can try topical diclofenac if pain progression we can reassess  Asks about  Hemp oil   undetermined  Safety and efficacy   Patient Care Team: Apryl Brymer, Standley Brooking, MD as PCP - Meryle Ready, MD as Consulting Physician (Orthopedic Surgery)  Patient Instructions   Health Maintenance Due  Topic Date Due  . TETANUS/TDAP Please stop by your local pharmacy to have this done  06/20/1963  . COLONOSCOPY  06/19/1994  . PNA vac Low Risk Adult (1 of 2 - PCV13)  06/19/2009  . COLON CANCER SCREENING ANNUAL FOBT  12/03/2018    Depression screen [facility] 2/9 07/10/2018 12/03/2017 11/13/2016  Decreased Interest 0 0 0  Down, Depressed, Hopeless 0 0 0  PHQ - 2 Score 0 0 0   Stool tests.  limite iboprofen in case adding to swelling.  checcking lab today. Try compresion stockking in am to see if helps reduce swelling. Can try OTC topical diclofenac gel for arthritis   Also  Up to   4 x per day .   Continue lifestyle intervention healthy eating and exercise .   Plan fu if  Swelling gets worse or pain etc .  Otherwise yearly cpx    Standley Brooking. Codylee Patil M.D.

## 2018-12-14 ENCOUNTER — Other Ambulatory Visit: Payer: Self-pay

## 2018-12-14 ENCOUNTER — Ambulatory Visit (INDEPENDENT_AMBULATORY_CARE_PROVIDER_SITE_OTHER): Payer: Medicare Other | Admitting: Internal Medicine

## 2018-12-14 ENCOUNTER — Encounter: Payer: Self-pay | Admitting: Internal Medicine

## 2018-12-14 VITALS — BP 132/62 | HR 96 | Temp 98.9°F | Ht 62.75 in | Wt 146.0 lb

## 2018-12-14 DIAGNOSIS — Z1211 Encounter for screening for malignant neoplasm of colon: Secondary | ICD-10-CM

## 2018-12-14 DIAGNOSIS — M7989 Other specified soft tissue disorders: Secondary | ICD-10-CM

## 2018-12-14 DIAGNOSIS — Z0000 Encounter for general adult medical examination without abnormal findings: Secondary | ICD-10-CM

## 2018-12-14 DIAGNOSIS — Z79899 Other long term (current) drug therapy: Secondary | ICD-10-CM

## 2018-12-14 DIAGNOSIS — E785 Hyperlipidemia, unspecified: Secondary | ICD-10-CM | POA: Diagnosis not present

## 2018-12-14 DIAGNOSIS — R739 Hyperglycemia, unspecified: Secondary | ICD-10-CM

## 2018-12-14 LAB — CBC WITH DIFFERENTIAL/PLATELET
Basophils Absolute: 0 10*3/uL (ref 0.0–0.1)
Basophils Relative: 0.3 % (ref 0.0–3.0)
Eosinophils Absolute: 0.1 10*3/uL (ref 0.0–0.7)
Eosinophils Relative: 1.1 % (ref 0.0–5.0)
HCT: 37.8 % (ref 36.0–46.0)
Hemoglobin: 12.4 g/dL (ref 12.0–15.0)
Lymphocytes Relative: 28.5 % (ref 12.0–46.0)
Lymphs Abs: 1.6 10*3/uL (ref 0.7–4.0)
MCHC: 33 g/dL (ref 30.0–36.0)
MCV: 96.5 fl (ref 78.0–100.0)
Monocytes Absolute: 0.7 10*3/uL (ref 0.1–1.0)
Monocytes Relative: 12 % (ref 3.0–12.0)
Neutro Abs: 3.3 10*3/uL (ref 1.4–7.7)
Neutrophils Relative %: 58.1 % (ref 43.0–77.0)
Platelets: 388 10*3/uL (ref 150.0–400.0)
RBC: 3.91 Mil/uL (ref 3.87–5.11)
RDW: 13.8 % (ref 11.5–15.5)
WBC: 5.8 10*3/uL (ref 4.0–10.5)

## 2018-12-14 LAB — HEPATIC FUNCTION PANEL
ALT: 11 U/L (ref 0–35)
AST: 12 U/L (ref 0–37)
Albumin: 4.3 g/dL (ref 3.5–5.2)
Alkaline Phosphatase: 71 U/L (ref 39–117)
Bilirubin, Direct: 0.1 mg/dL (ref 0.0–0.3)
Total Bilirubin: 0.3 mg/dL (ref 0.2–1.2)
Total Protein: 7.2 g/dL (ref 6.0–8.3)

## 2018-12-14 LAB — LDL CHOLESTEROL, DIRECT: Direct LDL: 133 mg/dL

## 2018-12-14 LAB — LIPID PANEL
Cholesterol: 225 mg/dL — ABNORMAL HIGH (ref 0–200)
HDL: 65.8 mg/dL (ref >39.00)
NonHDL: 159.53
Total CHOL/HDL Ratio: 3
Triglycerides: 241 mg/dL — ABNORMAL HIGH (ref 0.0–149.0)
VLDL: 48.2 mg/dL — ABNORMAL HIGH (ref 0.0–40.0)

## 2018-12-14 LAB — HEMOGLOBIN A1C: Hgb A1c MFr Bld: 6.3 % (ref 4.6–6.5)

## 2018-12-14 LAB — BASIC METABOLIC PANEL
BUN: 21 mg/dL (ref 6–23)
CO2: 25 mEq/L (ref 19–32)
Calcium: 9.8 mg/dL (ref 8.4–10.5)
Chloride: 104 mEq/L (ref 96–112)
Creatinine, Ser: 1.11 mg/dL (ref 0.40–1.20)
GFR: 58.06 mL/min — ABNORMAL LOW (ref >60.00)
Glucose, Bld: 94 mg/dL (ref 70–99)
Potassium: 4.2 mEq/L (ref 3.5–5.1)
Sodium: 139 mEq/L (ref 135–145)

## 2018-12-14 MED ORDER — IBUPROFEN 800 MG PO TABS: 800.0000 mg | ORAL_TABLET | Freq: Three times a day (TID) | ORAL | 1 refills | Status: DC | PRN

## 2018-12-14 NOTE — Patient Instructions (Addendum)
Health Maintenance Due  Topic Date Due  . TETANUS/TDAP Please stop by your local pharmacy to have this done  06/20/1963  . COLONOSCOPY  06/19/1994  . PNA vac Low Risk Adult (1 of 2 - PCV13)  06/19/2009  . COLON CANCER SCREENING ANNUAL FOBT  12/03/2018    Depression screen [facility] 2/9 07/10/2018 12/03/2017 11/13/2016  Decreased Interest 0 0 0  Down, Depressed, Hopeless 0 0 0  PHQ - 2 Score 0 0 0   Stool tests.  limite iboprofen in case adding to swelling.  checcking lab today. Try compresion stockking in am to see if helps reduce swelling. Can try OTC topical diclofenac gel for arthritis   Also  Up to   4 x per day .   Continue lifestyle intervention healthy eating and exercise .   Plan fu if  Swelling gets worse or pain etc .  Otherwise yearly cpx

## 2018-12-24 ENCOUNTER — Other Ambulatory Visit (INDEPENDENT_AMBULATORY_CARE_PROVIDER_SITE_OTHER): Payer: Medicare Other

## 2018-12-24 DIAGNOSIS — Z0000 Encounter for general adult medical examination without abnormal findings: Secondary | ICD-10-CM | POA: Diagnosis not present

## 2018-12-24 DIAGNOSIS — Z1211 Encounter for screening for malignant neoplasm of colon: Secondary | ICD-10-CM | POA: Diagnosis not present

## 2018-12-24 NOTE — Addendum Note (Signed)
Addended by: Diona Foley on: 12/24/2018 04:22 PM   Modules accepted: Orders

## 2018-12-28 LAB — FECAL OCCULT BLOOD, IMMUNOCHEMICAL: Fecal Occult Bld: NEGATIVE

## 2018-12-30 ENCOUNTER — Telehealth: Payer: Self-pay | Admitting: Internal Medicine

## 2018-12-30 NOTE — Telephone Encounter (Signed)
Pt. Given results and instructions. Will call back to schedule repeat labs.

## 2018-12-31 DIAGNOSIS — M1711 Unilateral primary osteoarthritis, right knee: Secondary | ICD-10-CM | POA: Diagnosis not present

## 2019-03-30 ENCOUNTER — Telehealth: Payer: Self-pay

## 2019-03-30 ENCOUNTER — Other Ambulatory Visit: Payer: Self-pay | Admitting: Internal Medicine

## 2019-03-30 DIAGNOSIS — E2839 Other primary ovarian failure: Secondary | ICD-10-CM

## 2019-03-30 DIAGNOSIS — Z1231 Encounter for screening mammogram for malignant neoplasm of breast: Secondary | ICD-10-CM

## 2019-03-30 NOTE — Telephone Encounter (Signed)
Copied from [place] [id number]. Topic: General - Other >> Mar 30, 2019  2:58 PM Rainey Pines A wrote: Patient needs a bone density report from Dr. Regis Bill in regards to getting a mammogram . Please advise.

## 2019-03-30 NOTE — Telephone Encounter (Signed)
Yes please do  Dx estrogen deficiency  And screening for  Breast cancer ?

## 2019-03-30 NOTE — Telephone Encounter (Signed)
Pt states she needs order for bone density and mammogram okay to place orders?

## 2019-03-31 NOTE — Telephone Encounter (Signed)
Orders have been placed left voicemail notifying pt

## 2019-04-07 ENCOUNTER — Other Ambulatory Visit: Payer: Self-pay | Admitting: Internal Medicine

## 2019-04-07 DIAGNOSIS — L509 Urticaria, unspecified: Secondary | ICD-10-CM

## 2019-05-21 ENCOUNTER — Other Ambulatory Visit: Payer: Self-pay | Admitting: Internal Medicine

## 2019-06-25 ENCOUNTER — Ambulatory Visit: Payer: Medicare Other

## 2019-06-25 ENCOUNTER — Other Ambulatory Visit: Payer: Medicare Other

## 2019-07-25 ENCOUNTER — Ambulatory Visit: Payer: Medicare Other | Attending: Internal Medicine

## 2019-07-25 DIAGNOSIS — Z23 Encounter for immunization: Secondary | ICD-10-CM | POA: Insufficient documentation

## 2019-07-25 NOTE — Progress Notes (Signed)
   Covid-19 Vaccination Clinic  Name:  Whitney Payne    MRN: [id number] DOB: April 28, 1946  07/25/2019  [name] was observed post Covid-19 immunization for 15 minutes without incidence. She was provided with Vaccine Information Sheet and instruction to access the V-Safe system.   [name] was instructed to call 911 with any severe reactions post vaccine: Marland Kitchen Difficulty breathing  . Swelling of your face and throat  . A fast heartbeat  . A bad rash all over your body  . Dizziness and weakness    Immunizations Administered    Name Date Dose VIS Date Route   Pfizer COVID-19 Vaccine 07/25/2019  1:33 PM 0.3 mL 05/14/2019 Intramuscular   Manufacturer: [employer], [employer]   Lot: [id number]   NDC: [address]-1

## 2019-07-31 ENCOUNTER — Other Ambulatory Visit: Payer: Self-pay | Admitting: Internal Medicine

## 2019-08-18 ENCOUNTER — Ambulatory Visit: Payer: Medicare Other | Attending: Internal Medicine

## 2019-08-18 DIAGNOSIS — Z23 Encounter for immunization: Secondary | ICD-10-CM

## 2019-08-18 NOTE — Progress Notes (Signed)
   Covid-19 Vaccination Clinic  Name:  Whitney Payne    MRN: [id number] DOB: 10/25/1945  08/18/2019  [name] was observed post Covid-19 immunization for 15 minutes without incident. She was provided with Vaccine Information Sheet and instruction to access the V-Safe system.   [name] was instructed to call 911 with any severe reactions post vaccine: Marland Kitchen Difficulty breathing  . Swelling of face and throat  . A fast heartbeat  . A bad rash all over body  . Dizziness and weakness   Immunizations Administered    Name Date Dose VIS Date Route   Pfizer COVID-19 Vaccine 08/18/2019 10:06 AM 0.3 mL 05/14/2019 Intramuscular   Manufacturer: [employer], [employer]   Lot: [id number]   NDC: [address]-8

## 2019-08-20 ENCOUNTER — Ambulatory Visit: Payer: Medicare Other | Admitting: Internal Medicine

## 2019-09-05 NOTE — Progress Notes (Signed)
This visit occurred during the SARS-CoV-2 public health emergency.  Safety protocols were in place, including screening questions prior to the visit, additional usage of staff PPE, and extensive cleaning of exam room while observing appropriate contact time as indicated for disinfecting solutions.    Chief Complaint  Patient presents with  . Edema    In Knees and Ankles    HPI: Whitney Payne 74 y.o. 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   Feels well otherwise    Had been given mobic last year but noted blood at stool that resolved when stopped and nwo taking ibuprofen about once a dya  ( more than  A few times per week ) using topical also  ROS: See pertinent positives and negatives per HPI. No cv pulm sx    Had covid vaccine complete   Past Medical History:  Diagnosis Date  . Abnormal EKG    nonspecific ST and T-wave changes  . Headache(784.0)   . Knee problem   . Postmenopausal HRT (hormone replacement therapy)   . Recurrent vertigo    neuro eval see mri report    Family History  Problem Relation Age of Onset  . Other Mother        died of se of patch narcotic, had artery diesease and amputation  . Other Father        collapsed lung- smoker    Social History   Socioeconomic History  . Marital status: Legally Separated    Spouse name: Not on file  . Number of children: Not on file  . Years of education: Not on file  . Highest education level: Not on file  Occupational History  . Not on file  Tobacco Use  . Smoking status: Former Smoker    Quit date: 10/14/1963    Years since quitting: 55.9  . Smokeless tobacco: Never Used  Substance and Sexual Activity  . Alcohol use: Yes      Comment: occasionally  . Drug use: No  . Sexual activity: Not on file  Other Topics Concern  . 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 Difficulty of Paying Living Expenses:   Food Insecurity:   . Worried About Charity fundraiser in the Last Year:   . Arboriculturist in the Last Year:   Transportation Needs:   . Film/video editor (Medical):   Marland Kitchen Lack of Transportation (Non-Medical):   Physical Activity:   . Days of Exercise per Week:   . Minutes of Exercise per Session:   Stress:   . Feeling of Stress :   Social Connections:   . Frequency of Communication with Friends and Family:   . Frequency of Social Gatherings with Friends and Family:   . Attends Religious Services:   . Active Member of Clubs or Organizations:   . Attends Archivist Meetings:   Marland Kitchen Marital Status:  Outpatient Medications Prior to Visit  Medication Sig Dispense Refill  . Calcium Carbonate-Vitamin D (CALCIUM + D PO) Take by mouth.      . cetirizine (ZYRTEC) 10 MG tablet TAKE 1 TABLET BY MOUTH EVERY DAY 30 tablet 11  . ibuprofen (ADVIL) 800 MG tablet     . ibuprofen (ADVIL) 800 MG tablet TAKE 1 TABLET BY MOUTH THREE TIMES A DAY AS NEEDED 30 tablet 1  . vitamin B-12 (CYANOCOBALAMIN) 100 MCG tablet Take 100 mcg by mouth daily.       No facility-administered medications prior to visit.     EXAM:  BP 138/82   Pulse 92   Temp 97.9 F (36.6 C) (Temporal)   Ht 5' 2.75" (1.594 m)   Wt 148 lb 3.2 oz (67.2 kg)   SpO2 94%   BMI 26.46 kg/m   Body mass index is 26.46 kg/m. Wt Readings from Last 3 Encounters:  09/06/19 148 lb 3.2 oz (67.2 kg)  12/14/18 146 lb (66.2 kg)  07/10/18 146 lb (66.2 kg)    GENERAL: vitals reviewed and listed above, alert, oriented, appears well hydrated and in no acute distress HEENT: atraumatic,  conjunctiva  clear, no obvious abnormalities on inspection of external nose and ears OP : masked  NECK: no obvious masses on inspection palpation  LUNGS: clear to auscultation bilaterally, no wheezes, rales or rhonchi, good air movement CV: HRRR, no clubbing cyanosis or  nl cap refill  MS: moves all extremities without noticeable focal  Abnormality knees  Arthritis but no acute effusion  ankels are puffy 1-2+ feet ok pulses nl and no VV seen   Gait ok mildy stiff from kenee no redness or acute joint pain .  PSYCH: pleasant and cooperative, no obvious depression or anxiety Lab Results  Component Value Date   WBC 5.8 12/14/2018   HGB 12.4 12/14/2018   HCT 37.8 12/14/2018   PLT 388.0 12/14/2018   GLUCOSE 94 12/14/2018   CHOL 225 (H) 12/14/2018   TRIG 241.0 (H) 12/14/2018   HDL 65.80 12/14/2018   LDLDIRECT 133.0 12/14/2018   LDLCALC 130 (H) 12/03/2017   ALT 11 12/14/2018   AST 12 12/14/2018   NA 139 12/14/2018   K 4.2 12/14/2018   CL 104 12/14/2018   CREATININE 1.11 12/14/2018   BUN 21 12/14/2018   CO2 25 12/14/2018   TSH 0.39 11/13/2016   HGBA1C 6.3 12/14/2018   BP Readings from Last 3 Encounters:  09/06/19 138/82  12/14/18 132/62  07/10/18 136/84    ASSESSMENT AND PLAN:  Discussed the following assessment and plan:  Bilateral leg edema - Plan: Basic metabolic panel, CBC with Differential/Platelet, Microalbumin / creatinine urine ratio  Arthritis of knee - Plan: Basic metabolic panel, CBC with Differential/Platelet, Microalbumin / creatinine urine ratio  Medication management - Plan: Basic metabolic panel, CBC with Differential/Platelet, Microalbumin / creatinine urine ratio Edema seems to be dependent  Consider from daily ibuprofen  Do not see alarm sx and no sx of pulm chf etc  Moderated ls intervention nd lab today  If  persistent or progressive   Then contact us for reeval . Don't think diuretic is indicated nor helpful this time .   Colon screen says has yearly kit for  cancer screen  Low threshold to do more eval if recurs  But was almost a year ago when this episode happened  -Patient advised to return or notify health care team  if  new concerns arise. Or of progressing sx  Outside external source  DATA REVIEWED:  Dr Berenice Primas    Total time on date  of service including record review ordering  Differential dx and plan of care:  30 minutes     Patient Instructions  Checking   Lab to make sure   Swelling   Not from   Kidney problem  Or med effect.      Limit   Oral  Intake of sodium.    Read labels as doing  .  Compression socks may help some   Let us know if  Getting worse      Edema  Edema is an abnormal buildup of fluids in the body tissues and under the skin. Swelling of the legs, feet, and ankles is a common symptom that becomes more likely as you get older. Swelling is also common in looser tissues, like around the eyes. When the affected area is squeezed, the fluid may move out of that spot and leave a dent for a few moments. This dent is called pitting edema. There are many possible causes of edema. Eating too much salt (sodium) and being on your feet or sitting for a long time can cause edema in your legs, feet, and ankles. Hot weather may make edema worse. Common causes of edema include:  Heart failure.  Liver or kidney disease.  Weak leg blood vessels.  Cancer.  An injury.  Pregnancy.  Medicines.  Being obese.  Low protein levels in the blood. Edema is usually painless. Your skin may look swollen or shiny. Follow these instructions at home:  Keep the affected body part raised (elevated) above the level of your heart when you are sitting or lying down.  Do not sit still or stand for long periods of time.  Do not wear tight clothing. Do not wear garters on your upper legs.  Exercise your legs to get your circulation going. This helps to move the fluid back into your blood vessels, and it may help the swelling go down.  Wear  elastic bandages or support stockings to reduce swelling as told by your health care provider.  Eat a low-salt (low-sodium) diet to reduce fluid as told by your health care provider.  Depending on the cause of your swelling, you may need to limit how much fluid you drink (fluid restriction).  Take over-the-counter and prescription medicines only as told by your health care provider. Contact a health care provider if:  Your edema does not get better with treatment.  You have heart, liver, or kidney disease and have symptoms of edema.  You have sudden and unexplained weight gain. Get help right away if:  You develop shortness of breath or chest pain.  You cannot breathe when you lie down.  You develop pain, redness, or warmth in the swollen areas.  You have heart, liver, or kidney disease and suddenly get edema.  You have a fever and your symptoms suddenly get worse. Summary  Edema is an abnormal buildup of fluids in the body tissues and under the skin.  Eating too much salt (sodium) and being on your feet or sitting for a long time can cause edema in your legs, feet, and ankles.  Keep the affected body part raised (elevated) above the level of your heart when you are sitting or lying down. This information is not intended to replace advice given to you by your health care provider. Make sure you discuss any questions you have with your health care provider. Document Revised:  10/07/2018 Document Reviewed: 06/22/2016 Elsevier Patient Education  2020 [place] [employer] Cwynar M.D.

## 2019-09-06 ENCOUNTER — Ambulatory Visit (INDEPENDENT_AMBULATORY_CARE_PROVIDER_SITE_OTHER): Payer: Medicare Other | Admitting: Internal Medicine

## 2019-09-06 ENCOUNTER — Other Ambulatory Visit: Payer: Self-pay

## 2019-09-06 ENCOUNTER — Encounter: Payer: Self-pay | Admitting: Internal Medicine

## 2019-09-06 VITALS — BP 138/82 | HR 92 | Temp 97.9°F | Ht 62.75 in | Wt 148.2 lb

## 2019-09-06 DIAGNOSIS — M1710 Unilateral primary osteoarthritis, unspecified knee: Secondary | ICD-10-CM | POA: Diagnosis not present

## 2019-09-06 DIAGNOSIS — Z79899 Other long term (current) drug therapy: Secondary | ICD-10-CM | POA: Diagnosis not present

## 2019-09-06 DIAGNOSIS — R600 Localized edema: Secondary | ICD-10-CM

## 2019-09-06 LAB — BASIC METABOLIC PANEL WITH GFR
BUN: 16 mg/dL (ref 6–23)
CO2: 27 meq/L (ref 19–32)
Calcium: 9.7 mg/dL (ref 8.4–10.5)
Chloride: 104 meq/L (ref 96–112)
Creatinine, Ser: 0.73 mg/dL (ref 0.40–1.20)
GFR: 93.99 mL/min
Glucose, Bld: 88 mg/dL (ref 70–99)
Potassium: 3.9 meq/L (ref 3.5–5.1)
Sodium: 140 meq/L (ref 135–145)

## 2019-09-06 LAB — CBC WITH DIFFERENTIAL/PLATELET
Basophils Absolute: 0 10*3/uL (ref 0.0–0.1)
Basophils Relative: 0.6 % (ref 0.0–3.0)
Eosinophils Absolute: 0.1 10*3/uL (ref 0.0–0.7)
Eosinophils Relative: 0.9 % (ref 0.0–5.0)
HCT: 34.6 % — ABNORMAL LOW (ref 36.0–46.0)
Hemoglobin: 11.6 g/dL — ABNORMAL LOW (ref 12.0–15.0)
Lymphocytes Relative: 28.7 % (ref 12.0–46.0)
Lymphs Abs: 1.9 10*3/uL (ref 0.7–4.0)
MCHC: 33.4 g/dL (ref 30.0–36.0)
MCV: 94.3 fl (ref 78.0–100.0)
Monocytes Absolute: 0.6 10*3/uL (ref 0.1–1.0)
Monocytes Relative: 8.5 % (ref 3.0–12.0)
Neutro Abs: 4.1 10*3/uL (ref 1.4–7.7)
Neutrophils Relative %: 61.3 % (ref 43.0–77.0)
Platelets: 319 10*3/uL (ref 150.0–400.0)
RBC: 3.67 Mil/uL — ABNORMAL LOW (ref 3.87–5.11)
RDW: 14.2 % (ref 11.5–15.5)
WBC: 6.8 10*3/uL (ref 4.0–10.5)

## 2019-09-06 LAB — MICROALBUMIN / CREATININE URINE RATIO
Creatinine,U: 108.9 mg/dL
Microalb Creat Ratio: 0.6 mg/g (ref 0.0–30.0)
Microalb, Ur: <0.7 mg/dL (ref 0.0–1.9)

## 2019-09-06 NOTE — Patient Instructions (Signed)
Checking   Lab to make sure   Swelling   Not from   Kidney problem  Or med effect.      Limit   Oral  Intake of sodium.    Read labels as doing  .  Compression socks may help some   Let us know if  Getting worse      Edema  Edema is an abnormal buildup of fluids in the body tissues and under the skin. Swelling of the legs, feet, and ankles is a common symptom that becomes more likely as you get older. Swelling is also common in looser tissues, like around the eyes. When the affected area is squeezed, the fluid may move out of that spot and leave a dent for a few moments. This dent is called pitting edema. There are many possible causes of edema. Eating too much salt (sodium) and being on your feet or sitting for a long time can cause edema in your legs, feet, and ankles. Hot weather may make edema worse. Common causes of edema include:  Heart failure.  Liver or kidney disease.  Weak leg blood vessels.  Cancer.  An injury.  Pregnancy.  Medicines.  Being obese.  Low protein levels in the blood. Edema is usually painless. Your skin may look swollen or shiny. Follow these instructions at home:  Keep the affected body part raised (elevated) above the level of your heart when you are sitting or lying down.  Do not sit still or stand for long periods of time.  Do not wear tight clothing. Do not wear garters on your upper legs.  Exercise your legs to get your circulation going. This helps to move the fluid back into your blood vessels, and it may help the swelling go down.  Wear elastic bandages or support stockings to reduce swelling as told by your health care provider.  Eat a low-salt (low-sodium) diet to reduce fluid as told by your health care provider.  Depending on the cause of your swelling, you may need to limit how much fluid you drink (fluid restriction).  Take over-the-counter and prescription medicines only as told by your health care provider. Contact a health  care provider if:  Your edema does not get better with treatment.  You have heart, liver, or kidney disease and have symptoms of edema.  You have sudden and unexplained weight gain. Get help right away if:  You develop shortness of breath or chest pain.  You cannot breathe when you lie down.  You develop pain, redness, or warmth in the swollen areas.  You have heart, liver, or kidney disease and suddenly get edema.  You have a fever and your symptoms suddenly get worse. Summary  Edema is an abnormal buildup of fluids in the body tissues and under the skin.  Eating too much salt (sodium) and being on your feet or sitting for a long time can cause edema in your legs, feet, and ankles.  Keep the affected body part raised (elevated) above the level of your heart when you are sitting or lying down. This information is not intended to replace advice given to you by your health care provider. Make sure you discuss any questions you have with your health care provider. Document Revised: 10/07/2018 Document Reviewed: 06/22/2016 Elsevier Patient Education  2020 [employer].

## 2019-09-08 ENCOUNTER — Other Ambulatory Visit: Payer: Self-pay | Admitting: Internal Medicine

## 2019-09-08 ENCOUNTER — Other Ambulatory Visit: Payer: Self-pay

## 2019-09-08 DIAGNOSIS — D649 Anemia, unspecified: Secondary | ICD-10-CM

## 2019-09-08 DIAGNOSIS — D619 Aplastic anemia, unspecified: Secondary | ICD-10-CM

## 2019-09-08 NOTE — Progress Notes (Signed)
Labs normal including kidney function except you are mildly anemic. This is not alarming but requires  follow up .   arrange  hemoccult cards( 3) to do to check for microscopic blood loss in stool that could cause this minimal anemia .  Check cbc diff ibc and ferritin in 2 months ( please order  future lab  ,no ov needed) dx anemia

## 2019-09-10 ENCOUNTER — Ambulatory Visit
Admission: RE | Admit: 2019-09-10 | Discharge: 2019-09-10 | Disposition: A | Discharge status: Home / Self Care | Payer: Medicare Other | Source: Ambulatory Visit | Attending: Internal Medicine | Admitting: Internal Medicine

## 2019-09-10 ENCOUNTER — Other Ambulatory Visit: Payer: Self-pay

## 2019-09-10 DIAGNOSIS — E2839 Other primary ovarian failure: Secondary | ICD-10-CM

## 2019-09-10 DIAGNOSIS — Z780 Asymptomatic menopausal state: Secondary | ICD-10-CM | POA: Diagnosis not present

## 2019-09-10 DIAGNOSIS — Z1231 Encounter for screening mammogram for malignant neoplasm of breast: Secondary | ICD-10-CM

## 2019-09-10 NOTE — Progress Notes (Signed)
Normal bone density

## 2019-10-07 ENCOUNTER — Other Ambulatory Visit: Payer: Self-pay | Admitting: Internal Medicine

## 2019-10-07 NOTE — Telephone Encounter (Signed)
Last OV 09/06/2019  Last filled 08/02/2019, # 30 with 1 refill  Just wanted to make sure you want to continue this dose?

## 2019-10-11 ENCOUNTER — Other Ambulatory Visit: Payer: Self-pay

## 2019-10-11 ENCOUNTER — Telehealth: Payer: Self-pay | Admitting: Internal Medicine

## 2019-10-11 MED ORDER — IBUPROFEN 800 MG PO TABS: 800.0000 mg | ORAL_TABLET | Freq: Three times a day (TID) | ORAL | 0 refills | Status: DC | PRN

## 2019-10-11 NOTE — Telephone Encounter (Signed)
Pt calling to follow up on her refill request awaiting med refill

## 2019-10-11 NOTE — Telephone Encounter (Signed)
Can refill x 1   Ibuprofen Following   For side effects of the ibuprofen  Kidney swelling and bleeding potential    Make sure she get the fu lab for anemia   ( fype the incorrect dx for the labs should be anemia and NOT   aplastic anemia)

## 2019-10-11 NOTE — Telephone Encounter (Signed)
I sent you a message about her Ibuprofen and called patient after receiving this message and she stated that she really needs this. Can you send this in? Looks like I sent a note about it last week.

## 2019-10-11 NOTE — Telephone Encounter (Signed)
I have sent in the Rx for the Ibuprofen. I will see if I am able to go in and change the Dx code.

## 2019-10-13 NOTE — Telephone Encounter (Signed)
Diagnosis code has been changed to anemia.

## 2019-11-10 ENCOUNTER — Other Ambulatory Visit: Payer: Medicare Other

## 2020-01-07 ENCOUNTER — Ambulatory Visit
Admission: EM | Admit: 2020-01-07 | Discharge: 2020-01-07 | Disposition: A | Discharge status: Home / Self Care | Payer: Medicare Other | Attending: Physician Assistant | Admitting: Physician Assistant

## 2020-01-07 ENCOUNTER — Other Ambulatory Visit: Payer: Self-pay

## 2020-01-07 ENCOUNTER — Ambulatory Visit (INDEPENDENT_AMBULATORY_CARE_PROVIDER_SITE_OTHER): Payer: Medicare Other

## 2020-01-07 DIAGNOSIS — J069 Acute upper respiratory infection, unspecified: Secondary | ICD-10-CM | POA: Diagnosis not present

## 2020-01-07 DIAGNOSIS — R05 Cough: Secondary | ICD-10-CM | POA: Diagnosis not present

## 2020-01-07 DIAGNOSIS — J9811 Atelectasis: Secondary | ICD-10-CM | POA: Diagnosis not present

## 2020-01-07 MED ORDER — ALBUTEROL SULFATE HFA 108 (90 BASE) MCG/ACT IN AERS: 1.0000 | INHALATION_SPRAY | Freq: Four times a day (QID) | RESPIRATORY_TRACT | 0 refills | Status: DC | PRN

## 2020-01-07 NOTE — Discharge Instructions (Signed)
Chest xray negative for pneumonia. You can try albuterol at night for wheezing if needed. Otherwise, continue to monitor symptoms. Keep hydrated, urine should be clear to pale yellow in color. Follow up with PCP if symptoms not improving. Go to the ED if experiencing fever, shortness of breath, chest pain.

## 2020-01-07 NOTE — ED Provider Notes (Signed)
[facility]    CSN: [id number] Arrival date & time: 01/07/20  0834      History   Chief Complaint Chief Complaint  Patient presents with   Cough    HPI Whitney Payne is a 74 y.o. female.   74 year old female comes in for 1 week history of URI symptoms. First started with productive cough, now nonproductive. Has had wheezing when laying down. Denies rhinorrhea, nasal congestion. Denies chest pain, shortness of breath. Denies fever, chills, body aches. Denies abdominal pain, nausea, vomiting, diarrhea. Denies loss of taste/smell. Former smoker.      Past Medical History:  Diagnosis Date   Abnormal EKG    nonspecific ST and T-wave changes   Headache(784.0)    Knee problem    Postmenopausal HRT (hormone replacement therapy)    Recurrent vertigo    neuro eval see mri report    Patient Active Problem List   Diagnosis Date Noted   Abnormal EKG 06/09/2015   Lymph node enlargement ?  on mri scan of head incidental finding  11/05/2013   Knee pain 11/05/2013   Dizziness and giddiness 05/14/2013   Menopausal hot flushes 07/08/2012   Medication management 07/08/2012   EPICONDYLITIS, RIGHT 08/25/2008   INCREASED BLOOD PRESSURE 08/25/2008   SLEEPLESSNESS 11/27/2007   BACK PAIN 01/20/2007   CHEST PAIN 01/20/2007   THYROID STIMULATING HORMONE, ABNORMAL 01/19/2007   HEADACHE 01/19/2007    Past Surgical History:  Procedure Laterality Date   ABDOMINAL HYSTERECTOMY     CATARACT EXTRACTION     TUBAL LIGATION      OB History    Gravida  2   Para  2   Term      Preterm      AB      Living        SAB      TAB      Ectopic      Multiple      Live Births               Home Medications    Prior to Admission medications   Medication Sig Start Date End Date Taking? Authorizing Provider  Calcium Carbonate-Vitamin D (CALCIUM + D PO) Take by mouth.     Yes [provider]  cetirizine (ZYRTEC) 10 MG tablet TAKE  1 TABLET BY MOUTH EVERY DAY 04/07/19  Yes Panosh, Neta Mends, MD  vitamin B-12 (CYANOCOBALAMIN) 100 MCG tablet Take 100 mcg by mouth daily.     Yes [provider]  albuterol (VENTOLIN HFA) 108 (90 Base) MCG/ACT inhaler Inhale 1-2 puffs into the lungs every 6 (six) hours as needed for wheezing or shortness of breath. 01/07/20   Cathie Hoops, Kinslie Hove V, PA-C  ibuprofen (ADVIL) 800 MG tablet Take 1 tablet (800 mg total) by mouth 3 (three) times daily as needed. 10/11/19   Panosh, Neta Mends, MD    Family History Family History  Problem Relation Age of Onset   Other Mother        died of se of patch narcotic, had artery diesease and amputation   Other Father        collapsed lung- smoker    Social History Social History   Tobacco Use   Smoking status: Former Smoker    Quit date: 10/14/1963    Years since quitting: 56.2   Smokeless tobacco: Never Used  Vaping Use   Vaping Use: Never used  Substance Use Topics   Alcohol use:  Yes    Comment: occasionally   Drug use: No    Allergies   Penicillins and Tizanidine   Review of Systems Review of Systems  Reason unable to perform ROS: See HPI as above.     Physical Exam Triage Vital Signs ED Triage Vitals  Enc Vitals Group     BP 01/07/20 0842 (!) 173/80     Pulse Rate 01/07/20 0842 92     Resp 01/07/20 0842 16     Temp 01/07/20 0842 98.1 F (36.7 C)     Temp Source 01/07/20 0842 Oral     SpO2 01/07/20 0842 94 %     Weight --      Height --      Head Circumference --      Peak Flow --      Pain Score 01/07/20 0857 0     Pain Loc --      Pain Edu? --      Excl. in GC? --    No data found.  Updated Vital Signs BP (!) 173/80 (BP Location: Left Arm)    Pulse 92    Temp 98.1 F (36.7 C) (Oral)    Resp 16    SpO2 94%   Physical Exam Constitutional:      General: She is not in acute distress.    Appearance: Normal appearance. She is not ill-appearing, toxic-appearing or diaphoretic.  HENT:     Head: Normocephalic and  atraumatic.     Mouth/Throat:     Mouth: Mucous membranes are moist.     Pharynx: Oropharynx is clear. Uvula midline.  Cardiovascular:     Rate and Rhythm: Normal rate and regular rhythm.     Heart sounds: Normal heart sounds. No murmur heard.  No friction rub. No gallop.   Pulmonary:     Effort: Pulmonary effort is normal. No accessory muscle usage, prolonged expiration, respiratory distress or retractions.     Comments: Lungs clear to auscultation without adventitious lung sounds. Musculoskeletal:     Cervical back: Normal range of motion and neck supple.     Comments: Mild bilateral leg swelling, baseline per patient  Skin:    General: Skin is warm and dry.  Neurological:     General: No focal deficit present.     Mental Status: She is alert and oriented to person, place, and time.      UC Treatments / Results  Labs (all labs ordered are listed, but only abnormal results are displayed) Labs Reviewed - No data to display  EKG   Radiology DG Chest 2 View  Result Date: 01/07/2020 CLINICAL DATA:  Cough EXAM: CHEST - 2 VIEW COMPARISON:  01/21/2007 chest radiograph. FINDINGS: The heart size and mediastinal contours are within normal limits. Left basilar atelectasis. No pneumothorax or pleural effusion. No acute osseous abnormality. IMPRESSION: No acute airspace disease.  Left basilar atelectasis. Electronically Signed   By: Stana Bunting M.D.   On: 01/07/2020 09:16    Procedures Procedures (including critical care time)  Medications Ordered in UC Medications - No data to display  Initial Impression / Assessment and Plan / UC Course  I have reviewed the triage vital signs and the nursing notes.  Pertinent labs & imaging results that were available during my care of the patient were reviewed by me and considered in my medical decision making (see chart for details).    Patient afebrile, nontoxic. LCTAB. Mild leg swelling that is baseline per patient. Discussed CXR  results with patient. Symptomatic treatment discussed. Return precautions given.  Final Clinical Impressions(s) / UC Diagnoses   Final diagnoses:  Viral URI    ED Prescriptions    Medication Sig Dispense Auth. Provider   albuterol (VENTOLIN HFA) 108 (90 Base) MCG/ACT inhaler Inhale 1-2 puffs into the lungs every 6 (six) hours as needed for wheezing or shortness of breath. 8 g Belinda Fisher, PA-C     PDMP not reviewed this encounter.   Belinda Fisher, PA-C 01/07/20 [phone]

## 2020-01-07 NOTE — ED Triage Notes (Signed)
Pt c/o cough, "chest congestion" for past week. Pt reports cough was initially productive with clear secretions, now non-productive. Pt states when she lies down she feels "wheezy"; also c/o mild lightheadedness.  Denies actual CP, SOB, fever, chills, n/v/d, HA, congestion, runny nose. States throat feels "irritated from coughing". Denies any OTC use for symptoms.  Declines covid testing at this time.

## 2020-08-03 ENCOUNTER — Telehealth: Payer: Self-pay | Admitting: Internal Medicine

## 2020-08-03 NOTE — Telephone Encounter (Signed)
Left message asking patient to call office Please reschedule 08/07/20 appointment shannon will be out of office  

## 2020-08-07 ENCOUNTER — Ambulatory Visit: Payer: Medicare Other

## 2020-10-02 ENCOUNTER — Ambulatory Visit: Payer: Medicare Other

## 2020-10-20 ENCOUNTER — Ambulatory Visit
Admission: EM | Admit: 2020-10-20 | Discharge: 2020-10-20 | Disposition: A | Discharge status: Home / Self Care | Payer: Medicare Other | Attending: Family Medicine | Admitting: Family Medicine

## 2020-10-20 ENCOUNTER — Other Ambulatory Visit: Payer: Self-pay

## 2020-10-20 DIAGNOSIS — R059 Cough, unspecified: Secondary | ICD-10-CM | POA: Diagnosis not present

## 2020-10-20 DIAGNOSIS — K2100 Gastro-esophageal reflux disease with esophagitis, without bleeding: Secondary | ICD-10-CM

## 2020-10-20 MED ORDER — CETIRIZINE HCL 10 MG PO TABS: 10.0000 mg | ORAL_TABLET | Freq: Every day | ORAL | 11 refills | Status: DC

## 2020-10-20 MED ORDER — FAMOTIDINE 20 MG PO TABS: 20.0000 mg | ORAL_TABLET | Freq: Two times a day (BID) | ORAL | 0 refills | Status: DC | PRN

## 2020-10-20 NOTE — Discharge Instructions (Signed)
Start Pepcid 20 mg twice daily for the next 5 days then take as needed.  I am treating you for acid reflux induced cough.  Also I would like you to resume taking your cetirizine at bedtime as I feel that your nasal drainage is causing wheezing and coughing as well.  If any your symptoms persist and do not resolve with current treatment follow-up with Dr. Fabian Sharp for further work-up and evaluation

## 2020-10-20 NOTE — ED Provider Notes (Signed)
[facility]    CSN: [id number] Arrival date & time: 10/20/20  6834      History   Chief Complaint Chief Complaint  Patient presents with  . Cough    HPI Whitney Payne is a 75 y.o. female.   HPI  Patient presents today with cough and reports wheezing at bedtime.  She reports the cough for the most part is not present during the day.  Occasionally she will notice the cough following a meal.  Cough is most notable when lying down and it wakes her up in the middle of the night and is also present upon awakening.  She denies any shortness of breath. Past Medical History:  Diagnosis Date  . Abnormal EKG    nonspecific ST and T-wave changes  . Headache(784.0)   . Knee problem   . Postmenopausal HRT (hormone replacement therapy)   . Recurrent vertigo    neuro eval see mri report    Patient Active Problem List   Diagnosis Date Noted  . Abnormal EKG 06/09/2015  . Lymph node enlargement ?  on mri scan of head incidental finding  11/05/2013  . Knee pain 11/05/2013  . Dizziness and giddiness 05/14/2013  . Menopausal hot flushes 07/08/2012  . Medication management 07/08/2012  . EPICONDYLITIS, RIGHT 08/25/2008  . INCREASED BLOOD PRESSURE 08/25/2008  . SLEEPLESSNESS 11/27/2007  . BACK PAIN 01/20/2007  . CHEST PAIN 01/20/2007  . THYROID STIMULATING HORMONE, ABNORMAL 01/19/2007  . HEADACHE 01/19/2007    Past Surgical History:  Procedure Laterality Date  . ABDOMINAL HYSTERECTOMY    . CATARACT EXTRACTION    . TUBAL LIGATION      OB History    Gravida  2   Para  2   Term      Preterm      AB      Living        SAB      IAB      Ectopic      Multiple      Live Births               Home Medications    Prior to Admission medications   Medication Sig Start Date End Date Taking? Authorizing Provider  albuterol (VENTOLIN HFA) 108 (90 Base) MCG/ACT inhaler Inhale 1-2 puffs into the lungs every 6 (six) hours as needed for wheezing or  shortness of breath. 01/07/20   Cathie Hoops, Amy V, PA-C  Calcium Carbonate-Vitamin D (CALCIUM + D PO) Take by mouth.      [provider]  cetirizine (ZYRTEC) 10 MG tablet TAKE 1 TABLET BY MOUTH EVERY DAY 04/07/19   Panosh, Neta Mends, MD  ibuprofen (ADVIL) 800 MG tablet Take 1 tablet (800 mg total) by mouth 3 (three) times daily as needed. 10/11/19   Panosh, Neta Mends, MD  vitamin B-12 (CYANOCOBALAMIN) 100 MCG tablet Take 100 mcg by mouth daily.      [provider]    Family History Family History  Problem Relation Age of Onset  . Other Mother        died of se of patch narcotic, had artery diesease and amputation  . Other Father        collapsed lung- smoker    Social History Social History   Tobacco Use  . Smoking status: Former Smoker    Quit date: 10/14/1963    Years since quitting: 57.0  . Smokeless tobacco: Never Used  Vaping Use  . Vaping  Use: Never used  Substance Use Topics  . Alcohol use: Yes    Comment: occasionally  . Drug use: No     Allergies   Penicillins and Tizanidine   Review of Systems Review of Systems Pertinent negatives listed in HPI Physical Exam Triage Vital Signs ED Triage Vitals  Enc Vitals Group     BP 10/20/20 1032 (!) 160/91     Pulse Rate 10/20/20 1032 98     Resp 10/20/20 1032 18     Temp 10/20/20 1032 97.9 F (36.6 C)     Temp Source 10/20/20 1032 Oral     SpO2 10/20/20 1032 97 %     Weight --      Height --      Head Circumference --      Peak Flow --      Pain Score 10/20/20 1033 0     Pain Loc --      Pain Edu? --      Excl. in GC? --    No data found.  Updated Vital Signs BP (!) 160/91 (BP Location: Left Arm)   Pulse 98   Temp 97.9 F (36.6 C) (Oral)   Resp 18   SpO2 97%   Visual Acuity Right Eye Distance:   Left Eye Distance:   Bilateral Distance:    Right Eye Near:   Left Eye Near:    Bilateral Near:     Physical Exam General appearance: Alert, Ill-appearing, no distress Head: Normocephalic,  without obvious abnormality, atraumatic ENT: External ears normal, congestion present, oropharynx normal  Respiratory: Respirations even , unlabored, no abnormal lung exam auscultate Heart: rate and rhythm normal. No gallop or murmurs noted on exam  Abdomen: BS +, no distention, no rebound tenderness, or no mass Extremities: No gross deformities Skin: Skin color, texture, turgor normal. No rashes seen  Psych: Appropriate mood and affect. Neurologic: GCS 15, normal coordination normal gait UC Treatments / Results  Labs (all labs ordered are listed, but only abnormal results are displayed) Labs Reviewed - No data to display  EKG   Radiology No results found.  Procedures Procedures (including critical care time)  Medications Ordered in UC Medications - No data to display  Initial Impression / Assessment and Plan / UC Course  I have reviewed the triage vital signs and the nursing notes.  Pertinent labs & imaging results that were available during my care of the patient were reviewed by me and considered in my medical decision making (see chart for details).     Given symptoms suspect gastric etiology will trial treatment for management of GERD with esophagitis with Pepcid 20 mg twice daily for 5 days then resume to as needed.  Patient does have some mild postnasal drainage present therefore also recommend starting cetirizine at bedtime.  Advised to follow-up with PCP for ongoing management and treatment.  Final Clinical Impressions(s) / UC Diagnoses   Final diagnoses:  Cough  Gastroesophageal reflux disease with esophagitis without hemorrhage     Discharge Instructions     Start Pepcid 20 mg twice daily for the next 5 days then take as needed.  I am treating you for acid reflux induced cough.  Also I would like you to resume taking your cetirizine at bedtime as I feel that your nasal drainage is causing wheezing and coughing as well.  If any your symptoms persist and do not  resolve with current treatment follow-up with Dr. Fabian Sharp for further work-up and evaluation  PDMP not reviewed this encounter.   Bing Neighbors, FNP 10/24/20 [phone]

## 2020-10-20 NOTE — ED Triage Notes (Signed)
Pt c/o cough and wheezing at night x3ks. States was seen here and had a chest x-ray that was normal. States wheezing is better when taking her inhaler that was given here. States still has a cough.

## 2021-01-03 ENCOUNTER — Ambulatory Visit
Admission: EM | Admit: 2021-01-03 | Discharge: 2021-01-03 | Disposition: A | Discharge status: Home / Self Care | Payer: Medicare Other | Attending: Family Medicine | Admitting: Family Medicine

## 2021-01-03 ENCOUNTER — Other Ambulatory Visit: Payer: Self-pay

## 2021-01-03 DIAGNOSIS — R000 Tachycardia, unspecified: Secondary | ICD-10-CM | POA: Insufficient documentation

## 2021-01-03 DIAGNOSIS — R42 Dizziness and giddiness: Secondary | ICD-10-CM | POA: Insufficient documentation

## 2021-01-03 DIAGNOSIS — R112 Nausea with vomiting, unspecified: Secondary | ICD-10-CM | POA: Diagnosis not present

## 2021-01-03 DIAGNOSIS — R059 Cough, unspecified: Secondary | ICD-10-CM | POA: Diagnosis not present

## 2021-01-03 DIAGNOSIS — R531 Weakness: Secondary | ICD-10-CM | POA: Diagnosis not present

## 2021-01-03 DIAGNOSIS — Z20822 Contact with and (suspected) exposure to covid-19: Secondary | ICD-10-CM | POA: Insufficient documentation

## 2021-01-03 HISTORY — DX: Unspecified osteoarthritis, unspecified site: M19.90

## 2021-01-03 LAB — SARS CORONAVIRUS 2 (TAT 6-24 HRS): SARS Coronavirus 2: NEGATIVE

## 2021-01-03 MED ORDER — BENZONATATE 200 MG PO CAPS: 200.0000 mg | ORAL_CAPSULE | Freq: Three times a day (TID) | ORAL | 0 refills | Status: DC | PRN

## 2021-01-03 MED ORDER — CARBAMIDE PEROXIDE 6.5 % OT SOLN: 5.0000 [drp] | Freq: Two times a day (BID) | OTIC | 0 refills | Status: DC

## 2021-01-03 MED ORDER — ONDANSETRON 4 MG PO TBDP: 4.0000 mg | ORAL_TABLET | Freq: Three times a day (TID) | ORAL | 0 refills | Status: DC | PRN

## 2021-01-03 NOTE — ED Triage Notes (Signed)
Pt c/o weakness and cough. States concerned for "food poisoning" from a sandwich she had last Friday. After eating the sandwich she felt too weak to get up on her own. There was associated nausea. States she is still weak but has improved. States she was able to eat soup last night without vomiting. States new onset cough on late Saturday with associated brown-tinged mucous.

## 2021-01-03 NOTE — Discharge Instructions (Addendum)
If your symptoms worsen at any time, come back for reevaluation or go directly to the emergency department

## 2021-01-03 NOTE — ED Provider Notes (Signed)
[facility]    CSN: [id number] Arrival date & time: 01/03/21  0959      History   Chief Complaint Chief Complaint  Patient presents with   Cough   Weakness    HPI Whitney Payne is a 75 y.o. female.   Patient presenting today with 4-day history of weakness, nausea and vomiting, lightheadedness, ear pressure and now cough the past 2 days.  The cough is been productive of brown and clear mucus.  She states the past day or so she has tolerated p.o. without vomiting.  No abdominal pain, fever, syncope, headache, vision changes, chest pain, shortness of breath, wheezing, rash.  She thinks it may be related to food poisoning from a [place] sandwich that she ate directly before onset of symptoms.  No known sick contacts recently.  No past history of cardiopulmonary issues that she is aware of.   Past Medical History:  Diagnosis Date   Abnormal EKG    nonspecific ST and T-wave changes   Arthritis    Headache(784.0)    Knee problem    Postmenopausal HRT (hormone replacement therapy)    Recurrent vertigo    neuro eval see mri report    Patient Active Problem List   Diagnosis Date Noted   Abnormal EKG 06/09/2015   Lymph node enlargement ?  on mri scan of head incidental finding  11/05/2013   Knee pain 11/05/2013   Dizziness and giddiness 05/14/2013   Menopausal hot flushes 07/08/2012   Medication management 07/08/2012   EPICONDYLITIS, RIGHT 08/25/2008   INCREASED BLOOD PRESSURE 08/25/2008   SLEEPLESSNESS 11/27/2007   BACK PAIN 01/20/2007   CHEST PAIN 01/20/2007   THYROID STIMULATING HORMONE, ABNORMAL 01/19/2007   HEADACHE 01/19/2007    Past Surgical History:  Procedure Laterality Date   ABDOMINAL HYSTERECTOMY     CATARACT EXTRACTION     TUBAL LIGATION      OB History     Gravida  2   Para  2   Term      Preterm      AB      Living         SAB      IAB      Ectopic      Multiple      Live Births               Home  Medications    Prior to Admission medications   Medication Sig Start Date End Date Taking? Authorizing Provider  benzonatate (TESSALON) 200 MG capsule Take 1 capsule (200 mg total) by mouth 3 (three) times daily as needed for cough. 01/03/21  Yes Particia Nearing, PA-C  carbamide peroxide (DEBROX) 6.5 % OTIC solution Place 5 drops into both ears 2 (two) times daily. 01/03/21  Yes Particia Nearing, PA-C  ondansetron (ZOFRAN ODT) 4 MG disintegrating tablet Take 1 tablet (4 mg total) by mouth every 8 (eight) hours as needed for nausea or vomiting. 01/03/21  Yes Particia Nearing, PA-C  albuterol (VENTOLIN HFA) 108 (90 Base) MCG/ACT inhaler Inhale 1-2 puffs into the lungs every 6 (six) hours as needed for wheezing or shortness of breath. 01/07/20   Cathie Hoops, Amy V, PA-C  Calcium Carbonate-Vitamin D (CALCIUM + D PO) Take by mouth.      [provider]  cetirizine (ZYRTEC) 10 MG tablet Take 1 tablet (10 mg total) by mouth daily. 10/20/20   Bing Neighbors, FNP  famotidine (PEPCID) 20 MG tablet  Take 1 tablet (20 mg total) by mouth 2 (two) times daily as needed for heartburn or indigestion. 10/20/20   Bing Neighbors, FNP  ibuprofen (ADVIL) 800 MG tablet Take 1 tablet (800 mg total) by mouth 3 (three) times daily as needed. 10/11/19   Panosh, Neta Mends, MD  vitamin B-12 (CYANOCOBALAMIN) 100 MCG tablet Take 100 mcg by mouth daily.      [provider]    Family History Family History  Problem Relation Age of Onset   Other Mother        died of se of patch narcotic, had artery diesease and amputation   Other Father        collapsed lung- smoker    Social History Social History   Tobacco Use   Smoking status: Former    Types: Cigarettes    Quit date: 10/14/1963    Years since quitting: 57.2   Smokeless tobacco: Never  Vaping Use   Vaping Use: Never used  Substance Use Topics   Alcohol use: Yes    Comment: occasionally   Drug use: No    Allergies   Penicillins and  Tizanidine  Review of Systems Review of Systems Per HPI  Physical Exam Triage Vital Signs ED Triage Vitals [01/03/21 1121]  Enc Vitals Group     BP (!) 152/87     Pulse Rate (!) 109     Resp 18     Temp 99 F (37.2 C)     Temp Source Oral     SpO2 91 %     Weight      Height      Head Circumference      Peak Flow      Pain Score 0     Pain Loc      Pain Edu?      Excl. in GC?    Orthostatic VS for the past 24 hrs:  BP- Sitting  01/03/21 1140 147/81    Updated Vital Signs BP (!) 152/87 (BP Location: Left Arm)   Pulse (!) 103 Comment: taken on discharge  Temp 99 F (37.2 C) (Oral)   Resp 18   SpO2 91%   Visual Acuity Right Eye Distance:   Left Eye Distance:   Bilateral Distance:    Right Eye Near:   Left Eye Near:    Bilateral Near:     Physical Exam Vitals and nursing note reviewed.  Constitutional:      Appearance: Normal appearance. She is not ill-appearing.  HENT:     Head: Atraumatic.     Nose: Nose normal.     Mouth/Throat:     Mouth: Mucous membranes are moist.     Pharynx: No oropharyngeal exudate or posterior oropharyngeal erythema.  Eyes:     Extraocular Movements: Extraocular movements intact.     Conjunctiva/sclera: Conjunctivae normal.  Cardiovascular:     Rate and Rhythm: Normal rate and regular rhythm.     Heart sounds: Normal heart sounds.  Pulmonary:     Effort: Pulmonary effort is normal. No respiratory distress.     Breath sounds: Normal breath sounds. No wheezing or rales.     Comments: Breathing comfortably on room air, no wheezes, rales, speaking in full sentences Abdominal:     General: Bowel sounds are normal. There is no distension.     Palpations: Abdomen is soft.     Tenderness: There is no abdominal tenderness. There is no right CVA tenderness, left CVA tenderness or  guarding.  Musculoskeletal:        General: Normal range of motion.     Cervical back: Normal range of motion and neck supple.  Skin:    General: Skin  is warm and dry.     Findings: No rash.  Neurological:     Mental Status: She is alert and oriented to person, place, and time.  Psychiatric:        Mood and Affect: Mood normal.        Thought Content: Thought content normal.        Judgment: Judgment normal.     UC Treatments / Results  Labs (all labs ordered are listed, but only abnormal results are displayed) Labs Reviewed  SARS CORONAVIRUS 2 (TAT 6-24 HRS)  CBC WITH DIFFERENTIAL/PLATELET  COMPREHENSIVE METABOLIC PANEL    EKG   Radiology No results found.  Procedures Procedures (including critical care time)  Medications Ordered in UC Medications - No data to display  Initial Impression / Assessment and Plan / UC Course  I have reviewed the triage vital signs and the nursing notes.  Pertinent labs & imaging results that were available during my care of the patient were reviewed by me and considered in my medical decision making (see chart for details).     Mildly tachycardic and hypertensive in triage, oxygen saturation fluctuating between 91 to 93%.  It appears that her typical baseline is around 93 to 94% and she declines any sensation of shortness of breath at this time.  EKG with normal sinus rhythm, 107 bpm, mild anterior T wave inversions which appear to be stable from last EKG available in chart in 2016.  Orthostatic vital signs reassuring and negative.  Suspect a GI virus versus COVID type virus.  COVID PCR pending, labs pending additionally for safety.  We will treat with Zofran to help encourage p.o. intake, Tessalon Perles.  Offered Symbicort inhaler for her cough but she declines this at this time.  Has albuterol at home.  Strict ED precautions given for worsening symptoms at any time.  Quarantine precautions while awaiting COVID test reviewed.  Final Clinical Impressions(s) / UC Diagnoses   Final diagnoses:  Weakness  Dizziness  Cough  Non-intractable vomiting with nausea, unspecified vomiting type   Tachycardia     Discharge Instructions      If your symptoms worsen at any time, come back for reevaluation or go directly to the emergency department     ED Prescriptions     Medication Sig Dispense Auth. Provider   ondansetron (ZOFRAN ODT) 4 MG disintegrating tablet Take 1 tablet (4 mg total) by mouth every 8 (eight) hours as needed for nausea or vomiting. 20 tablet Particia Nearing, [place]   benzonatate (TESSALON) 200 MG capsule Take 1 capsule (200 mg total) by mouth 3 (three) times daily as needed for cough. 20 capsule Particia Nearing, PA-C   carbamide peroxide (DEBROX) 6.5 % OTIC solution Place 5 drops into both ears 2 (two) times daily. 15 mL Particia Nearing, [place]      PDMP not reviewed this encounter.   Particia Nearing, [place] 01/03/21 1431

## 2021-01-04 LAB — COMPREHENSIVE METABOLIC PANEL
ALT: 22 IU/L (ref 0–32)
AST: 23 IU/L (ref 0–40)
Albumin/Globulin Ratio: 1 — ABNORMAL LOW (ref 1.2–2.2)
Albumin: 3.9 g/dL (ref 3.7–4.7)
Alkaline Phosphatase: 82 IU/L (ref 44–121)
BUN/Creatinine Ratio: 21 (ref 12–28)
BUN: 15 mg/dL (ref 8–27)
Bilirubin Total: 0.3 mg/dL (ref 0.0–1.2)
CO2: 23 mmol/L (ref 20–29)
Calcium: 10.1 mg/dL (ref 8.7–10.3)
Chloride: 102 mmol/L (ref 96–106)
Creatinine, Ser: 0.73 mg/dL (ref 0.57–1.00)
Globulin, Total: 3.8 g/dL (ref 1.5–4.5)
Glucose: 131 mg/dL — ABNORMAL HIGH (ref 65–99)
Potassium: 4 mmol/L (ref 3.5–5.2)
Sodium: 141 mmol/L (ref 134–144)
Total Protein: 7.7 g/dL (ref 6.0–8.5)
eGFR: 85 mL/min/{1.73_m2} (ref >59)

## 2021-01-04 LAB — CBC WITH DIFFERENTIAL/PLATELET
Basophils Absolute: 0 10*3/uL (ref 0.0–0.2)
Basos: 0 %
EOS (ABSOLUTE): 0 10*3/uL (ref 0.0–0.4)
Eos: 0 %
Hematocrit: 34.3 % (ref 34.0–46.6)
Hemoglobin: 11.5 g/dL (ref 11.1–15.9)
Immature Grans (Abs): 0 10*3/uL (ref 0.0–0.1)
Immature Granulocytes: 0 %
Lymphocytes Absolute: 1.7 10*3/uL (ref 0.7–3.1)
Lymphs: 12 %
MCH: 31.9 pg (ref 26.6–33.0)
MCHC: 33.5 g/dL (ref 31.5–35.7)
MCV: 95 fL (ref 79–97)
Monocytes Absolute: 1.4 10*3/uL — ABNORMAL HIGH (ref 0.1–0.9)
Monocytes: 10 %
Neutrophils Absolute: 11.1 10*3/uL — ABNORMAL HIGH (ref 1.4–7.0)
Neutrophils: 78 %
Platelets: 473 10*3/uL — ABNORMAL HIGH (ref 150–450)
RBC: 3.6 x10E6/uL — ABNORMAL LOW (ref 3.77–5.28)
RDW: 11.8 % (ref 11.7–15.4)
WBC: 14.3 10*3/uL — ABNORMAL HIGH (ref 3.4–10.8)

## 2021-01-10 ENCOUNTER — Encounter (HOSPITAL_COMMUNITY): Payer: Self-pay | Admitting: Radiology

## 2021-01-10 ENCOUNTER — Emergency Department (HOSPITAL_COMMUNITY)
Admission: EM | Admit: 2021-01-10 | Discharge: 2021-01-10 | Disposition: A | Discharge status: Home / Self Care | Payer: Medicare Other | Attending: Emergency Medicine | Admitting: Emergency Medicine

## 2021-01-10 ENCOUNTER — Emergency Department (HOSPITAL_COMMUNITY): Payer: Medicare Other

## 2021-01-10 DIAGNOSIS — Z87891 Personal history of nicotine dependence: Secondary | ICD-10-CM | POA: Insufficient documentation

## 2021-01-10 DIAGNOSIS — R0602 Shortness of breath: Secondary | ICD-10-CM | POA: Diagnosis not present

## 2021-01-10 DIAGNOSIS — R220 Localized swelling, mass and lump, head: Secondary | ICD-10-CM | POA: Diagnosis present

## 2021-01-10 DIAGNOSIS — R059 Cough, unspecified: Secondary | ICD-10-CM | POA: Diagnosis not present

## 2021-01-10 DIAGNOSIS — L03211 Cellulitis of face: Secondary | ICD-10-CM | POA: Diagnosis not present

## 2021-01-10 DIAGNOSIS — Z743 Need for continuous supervision: Secondary | ICD-10-CM | POA: Diagnosis not present

## 2021-01-10 DIAGNOSIS — J189 Pneumonia, unspecified organism: Secondary | ICD-10-CM

## 2021-01-10 DIAGNOSIS — R000 Tachycardia, unspecified: Secondary | ICD-10-CM | POA: Diagnosis not present

## 2021-01-10 DIAGNOSIS — J181 Lobar pneumonia, unspecified organism: Secondary | ICD-10-CM | POA: Insufficient documentation

## 2021-01-10 DIAGNOSIS — Z20822 Contact with and (suspected) exposure to covid-19: Secondary | ICD-10-CM | POA: Diagnosis not present

## 2021-01-10 DIAGNOSIS — M7989 Other specified soft tissue disorders: Secondary | ICD-10-CM | POA: Diagnosis not present

## 2021-01-10 LAB — COMPREHENSIVE METABOLIC PANEL
ALT: 68 U/L — ABNORMAL HIGH (ref 0–44)
AST: 82 U/L — ABNORMAL HIGH (ref 15–41)
Albumin: 2.2 g/dL — ABNORMAL LOW (ref 3.5–5.0)
Alkaline Phosphatase: 70 U/L (ref 38–126)
Anion gap: 10 (ref 5–15)
BUN: 16 mg/dL (ref 8–23)
CO2: 22 mmol/L (ref 22–32)
Calcium: 9 mg/dL (ref 8.9–10.3)
Chloride: 104 mmol/L (ref 98–111)
Creatinine, Ser: 0.87 mg/dL (ref 0.44–1.00)
GFR, Estimated: >60 mL/min (ref >60)
Glucose, Bld: 113 mg/dL — ABNORMAL HIGH (ref 70–99)
Potassium: 4.2 mmol/L (ref 3.5–5.1)
Sodium: 136 mmol/L (ref 135–145)
Total Bilirubin: 0.4 mg/dL (ref 0.3–1.2)
Total Protein: 6.8 g/dL (ref 6.5–8.1)

## 2021-01-10 LAB — CBC WITH DIFFERENTIAL/PLATELET
Abs Immature Granulocytes: 0.14 10*3/uL — ABNORMAL HIGH (ref 0.00–0.07)
Basophils Absolute: 0 10*3/uL (ref 0.0–0.1)
Basophils Relative: 0 %
Eosinophils Absolute: 0 10*3/uL (ref 0.0–0.5)
Eosinophils Relative: 0 %
HCT: 30.4 % — ABNORMAL LOW (ref 36.0–46.0)
Hemoglobin: 9.9 g/dL — ABNORMAL LOW (ref 12.0–15.0)
Immature Granulocytes: 1 %
Lymphocytes Relative: 11 %
Lymphs Abs: 1.7 10*3/uL (ref 0.7–4.0)
MCH: 32.4 pg (ref 26.0–34.0)
MCHC: 32.6 g/dL (ref 30.0–36.0)
MCV: 99.3 fL (ref 80.0–100.0)
Monocytes Absolute: 1.8 10*3/uL — ABNORMAL HIGH (ref 0.1–1.0)
Monocytes Relative: 13 %
Neutro Abs: 10.8 10*3/uL — ABNORMAL HIGH (ref 1.7–7.7)
Neutrophils Relative %: 75 %
Platelets: 606 10*3/uL — ABNORMAL HIGH (ref 150–400)
RBC: 3.06 MIL/uL — ABNORMAL LOW (ref 3.87–5.11)
RDW: 13.4 % (ref 11.5–15.5)
WBC: 14.5 10*3/uL — ABNORMAL HIGH (ref 4.0–10.5)
nRBC: 0 % (ref 0.0–0.2)

## 2021-01-10 LAB — RESP PANEL BY RT-PCR (FLU A&B, COVID) ARPGX2
Influenza A by PCR: NEGATIVE
Influenza B by PCR: NEGATIVE
SARS Coronavirus 2 by RT PCR: NEGATIVE

## 2021-01-10 LAB — LACTIC ACID, PLASMA: Lactic Acid, Venous: 1.6 mmol/L (ref 0.5–1.9)

## 2021-01-10 MED ORDER — IOHEXOL 300 MG/ML  SOLN
75.0000 mL | Freq: Once | INTRAMUSCULAR | Status: AC | PRN
  Administered 2021-01-10: 75 mL via INTRAVENOUS

## 2021-01-10 MED ORDER — METRONIDAZOLE 500 MG PO TABS: 500.0000 mg | ORAL_TABLET | Freq: Two times a day (BID) | ORAL | 0 refills | Status: DC

## 2021-01-10 MED ORDER — METRONIDAZOLE 500 MG PO TABS
500.0000 mg | ORAL_TABLET | Freq: Once | ORAL | Status: AC
  Administered 2021-01-10: 500 mg via ORAL
  Filled 2021-01-10: qty 1

## 2021-01-10 MED ORDER — CEFDINIR 300 MG PO CAPS: 300.0000 mg | ORAL_CAPSULE | Freq: Two times a day (BID) | ORAL | 0 refills | Status: DC

## 2021-01-10 MED ORDER — MORPHINE SULFATE (PF) 4 MG/ML IV SOLN
4.0000 mg | Freq: Once | INTRAVENOUS | Status: AC
  Administered 2021-01-10: 4 mg via INTRAVENOUS
  Filled 2021-01-10: qty 1

## 2021-01-10 MED ORDER — CEFDINIR 300 MG PO CAPS
300.0000 mg | ORAL_CAPSULE | Freq: Once | ORAL | Status: AC
  Administered 2021-01-10: 300 mg via ORAL
  Filled 2021-01-10 (×2): qty 1

## 2021-01-10 MED ORDER — LACTATED RINGERS IV BOLUS
1000.0000 mL | Freq: Once | INTRAVENOUS | Status: AC
  Administered 2021-01-10: 1000 mL via INTRAVENOUS

## 2021-01-10 NOTE — Discharge Instructions (Addendum)
If you develop high fever, severe cough or cough with blood, trouble breathing, severe headache, neck pain/stiffness, vomiting, or any other new/concerning symptoms then return to the ER for evaluation  

## 2021-01-10 NOTE — ED Notes (Signed)
Pt transported to CT ?

## 2021-01-10 NOTE — ED Triage Notes (Signed)
PT BIB [facility] after pt called to report increase facial swelling r/t to abscess on left side of jaw. Per EMS, pt went to UC  3 days ago but swelling has gotten worse over time.

## 2021-01-10 NOTE — ED Provider Notes (Signed)
[facility] EMERGENCY DEPARTMENT Provider Note   CSN: [id number] Arrival date & time: 01/10/21  5188     History Chief Complaint  Patient presents with   Abscess    jaw    Whitney Payne is a 75 y.o. female.  HPI 75 year old female presents with right jaw pain and swelling.  This has been present for over a month.  She is also been dealing with a cough that started also about a month ago but she states the jaw started first.  Her inner cheek and jaw both feel hard and swollen.  She is not eating and drinking as much because it hurts and she has lost some appetite.  She is not sure about weight loss.  She does endorse some on and off fevers over this past month, most recently 101 about 6 days ago.  Feels short of breath after the cough with some burning in her chest.  No dental pain.  Started to feel weak starting last night and so she called EMS this morning.  Past Medical History:  Diagnosis Date   Abnormal EKG    nonspecific ST and T-wave changes   Arthritis    Headache(784.0)    Knee problem    Postmenopausal HRT (hormone replacement therapy)    Recurrent vertigo    neuro eval see mri report    Patient Active Problem List   Diagnosis Date Noted   Abnormal EKG 06/09/2015   Lymph node enlargement ?  on mri scan of head incidental finding  11/05/2013   Knee pain 11/05/2013   Dizziness and giddiness 05/14/2013   Menopausal hot flushes 07/08/2012   Medication management 07/08/2012   EPICONDYLITIS, RIGHT 08/25/2008   INCREASED BLOOD PRESSURE 08/25/2008   SLEEPLESSNESS 11/27/2007   BACK PAIN 01/20/2007   CHEST PAIN 01/20/2007   THYROID STIMULATING HORMONE, ABNORMAL 01/19/2007   HEADACHE 01/19/2007    Past Surgical History:  Procedure Laterality Date   ABDOMINAL HYSTERECTOMY     CATARACT EXTRACTION     TUBAL LIGATION       OB History     Gravida  2   Para  2   Term      Preterm      AB      Living         SAB      IAB       Ectopic      Multiple      Live Births              Family History  Problem Relation Age of Onset   Other Mother        died of se of patch narcotic, had artery diesease and amputation   Other Father        collapsed lung- smoker    Social History   Tobacco Use   Smoking status: Former    Types: Cigarettes    Quit date: 10/14/1963    Years since quitting: 57.2   Smokeless tobacco: Never  Vaping Use   Vaping Use: Never used  Substance Use Topics   Alcohol use: Yes    Comment: occasionally   Drug use: No    Home Medications Prior to Admission medications   Medication Sig Start Date End Date Taking? Authorizing Provider  albuterol (VENTOLIN HFA) 108 (90 Base) MCG/ACT inhaler Inhale 1-2 puffs into the lungs every 6 (six) hours as needed for wheezing or shortness of breath. 01/07/20  Yes Cathie Hoops, Amy  V, PA-C  benzonatate (TESSALON) 200 MG capsule Take 1 capsule (200 mg total) by mouth 3 (three) times daily as needed for cough. 01/03/21  Yes Particia NearingLane, Rachel Elizabeth, PA-C  Calcium Carbonate-Vitamin D (CALCIUM + D PO) Take by mouth.     Yes [provider]  carbamide peroxide (DEBROX) 6.5 % OTIC solution Place 5 drops into both ears 2 (two) times daily. 01/03/21  Yes Particia NearingLane, Rachel Elizabeth, PA-C  cefdinir (OMNICEF) 300 MG capsule Take 1 capsule (300 mg total) by mouth 2 (two) times daily. 01/10/21  Yes Pricilla LovelessGoldston, Arianna Delsanto, MD  cetirizine (ZYRTEC) 10 MG tablet Take 1 tablet (10 mg total) by mouth daily. 10/20/20  Yes Bing NeighborsHarris, Kimberly S, FNP  chlorhexidine (PERIDEX) 0.12 % solution 15 mLs by Mouth Rinse route 2 (two) times daily. 12/19/20  Yes [provider]  famotidine (PEPCID) 20 MG tablet Take 1 tablet (20 mg total) by mouth 2 (two) times daily as needed for heartburn or indigestion. 10/20/20  Yes Bing NeighborsHarris, Kimberly S, FNP  metroNIDAZOLE (FLAGYL) 500 MG tablet Take 1 tablet (500 mg total) by mouth 2 (two) times daily. 01/10/21  Yes Pricilla LovelessGoldston, Jakye Mullens, MD  vitamin B-12 (CYANOCOBALAMIN)  100 MCG tablet Take 100 mcg by mouth daily.     Yes [provider]  ibuprofen (ADVIL) 800 MG tablet Take 1 tablet (800 mg total) by mouth 3 (three) times daily as needed. Patient not taking: No sig reported 10/11/19   Panosh, Neta MendsWanda K, MD  ondansetron (ZOFRAN ODT) 4 MG disintegrating tablet Take 1 tablet (4 mg total) by mouth every 8 (eight) hours as needed for nausea or vomiting. Patient not taking: No sig reported 01/03/21   Particia NearingLane, Rachel Elizabeth, PA-C    Allergies    Penicillins and Tizanidine  Review of Systems   Review of Systems  Constitutional:  Positive for fever. Negative for unexpected weight change.  HENT:  Positive for facial swelling.   Respiratory:  Positive for cough.   Neurological:  Positive for weakness.  All other systems reviewed and are negative.  Physical Exam Updated Vital Signs BP 131/71   Pulse 86   Temp 98.4 F (36.9 C) (Oral)   Resp 18   SpO2 97%   Physical Exam Vitals and nursing note reviewed.  Constitutional:      General: She is not in acute distress.    Appearance: She is well-developed. She is not ill-appearing or diaphoretic.  HENT:     Head: Normocephalic and atraumatic.      Right Ear: External ear normal.     Left Ear: External ear normal.     Nose: Nose normal.     Mouth/Throat:     Comments: No obvious dental abscess or obvious acute dental problem The right inner cheek is firm and tender, though not fluctuant Eyes:     General:        Right eye: No discharge.        Left eye: No discharge.  Cardiovascular:     Rate and Rhythm: Normal rate and regular rhythm.     Heart sounds: Normal heart sounds.     Comments: HR~100 Pulmonary:     Effort: Pulmonary effort is normal.     Breath sounds: Normal breath sounds.  Abdominal:     Palpations: Abdomen is soft.     Tenderness: There is no abdominal tenderness.  Skin:    General: Skin is warm and dry.  Neurological:     Mental Status: She is alert.  Psychiatric:         Mood and Affect: Mood is not anxious.    ED Results / Procedures / Treatments   Labs (all labs ordered are listed, but only abnormal results are displayed) Labs Reviewed  CBC WITH DIFFERENTIAL/PLATELET - Abnormal; Notable for the following components:      Result Value   WBC 14.5 (*)    RBC 3.06 (*)    Hemoglobin 9.9 (*)    HCT 30.4 (*)    Platelets 606 (*)    Neutro Abs 10.8 (*)    Monocytes Absolute 1.8 (*)    Abs Immature Granulocytes 0.14 (*)    All other components within normal limits  COMPREHENSIVE METABOLIC PANEL - Abnormal; Notable for the following components:   Glucose, Bld 113 (*)    Albumin 2.2 (*)    AST 82 (*)    ALT 68 (*)    All other components within normal limits  CULTURE, BLOOD (ROUTINE X 2)  RESP PANEL BY RT-PCR (FLU A&B, COVID) ARPGX2  CULTURE, BLOOD (ROUTINE X 2)  LACTIC ACID, PLASMA    EKG EKG Interpretation  Date/Time:  Wednesday January 10 2021 09:09:05 EDT Ventricular Rate:  96 PR Interval:  143 QRS Duration: 84 QT Interval:  336 QTC Calculation: 425 R Axis:   64 Text Interpretation: Sinus rhythm Probable left atrial enlargement no acute ST/T changes No old tracing to compare Confirmed by Pricilla Loveless [phone]) on 01/10/2021 9:10:26 AM  Radiology DG Chest 2 View  Result Date: 01/10/2021 CLINICAL DATA:  cough x 1 month EXAM: CHEST - 2 VIEW COMPARISON:  01/07/2020. FINDINGS: The heart size and mediastinal contours are within normal limits. Left upper lobe consolidation, new from prior. No visible pleural effusions or pneumothorax. No acute osseous abnormality. IMPRESSION: Left upper lobe consolidation, compatible with pneumonia. Followup PA and lateral chest X-ray is recommended in 3-4 weeks following trial of antibiotic therapy to ensure resolution and exclude underlying malignancy. Electronically Signed   By: Feliberto Harts MD   On: 01/10/2021 09:21   CT Maxillofacial W Contrast  Result Date: 01/10/2021 CLINICAL DATA:  Cellulitis, face  EXAM: CT MAXILLOFACIAL WITH CONTRAST TECHNIQUE: Multidetector CT imaging of the maxillofacial structures was performed with intravenous contrast. Multiplanar CT image reconstructions were also generated. CONTRAST:  66mL OMNIPAQUE IOHEXOL 300 MG/ML  SOLN COMPARISON:  None. FINDINGS: Osseous: Periapical lucencies about the posterior maxillary molars bilaterally. Degenerative changes of the cervical spine. Orbits: No significant abnormality. Sinuses: Minor maxillary sinus mucosal thickening. This may be odontogenic. Soft tissues: Right facial soft tissue swelling with soft tissue thickening along the buccal aspect of the right maxilla. No evidence of drainable abscess. Limited intracranial: No abnormal enhancement. IMPRESSION: Inflammatory changes in the right face with soft tissue thickening along the buccal aspect of the right maxilla. Most likely odontogenic with large periapical lucency about the posterior right maxillary molar. No evidence of drainable abscess. Electronically Signed   By: Guadlupe Spanish M.D.   On: 01/10/2021 10:02    Procedures Procedures   Medications Ordered in ED Medications  lactated ringers bolus 1,000 mL (0 mLs Intravenous Stopped 01/10/21 1207)  morphine 4 MG/ML injection 4 mg (4 mg Intravenous Given 01/10/21 1017)  iohexol (OMNIPAQUE) 300 MG/ML solution 75 mL (75 mLs Intravenous Contrast Given 01/10/21 0941)  cefdinir (OMNICEF) capsule 300 mg (300 mg Oral Given 01/10/21 1246)  metroNIDAZOLE (FLAGYL) tablet 500 mg (500 mg Oral Given 01/10/21 1247)    ED Course  I have reviewed the triage  vital signs and the nursing notes.  Pertinent labs & imaging results that were available during my care of the patient were reviewed by me and considered in my medical decision making (see chart for details).    MDM Rules/Calculators/A&P                           Patient had some mild tachycardia that has improved with fluids/pain control. CT and CXR reviewed. No abscess but has what  appears to be infection from dental source. Also with LUL pneumonia. D/w Pharmacist, recommends omnicef and flagyl to cover for dental infection and should mostly cover pneumonia.  She is not hypoxic or in distress.  She has a mildly worse anemia than typical but no signs or symptoms of acute bleeding on discussion with her.  She appears stable for outpatient management and she is already following up with her oral surgeon in 5 days. Final Clinical Impression(s) / ED Diagnoses Final diagnoses:  Facial cellulitis  Community acquired pneumonia of left upper lobe of lung    Rx / DC Orders ED Discharge Orders          Ordered    cefdinir (OMNICEF) 300 MG capsule  2 times daily        01/10/21 1204    metroNIDAZOLE (FLAGYL) 500 MG tablet  2 times daily        01/10/21 1204             Pricilla Loveless, MD 01/10/21 1601

## 2021-01-12 ENCOUNTER — Telehealth (HOSPITAL_BASED_OUTPATIENT_CLINIC_OR_DEPARTMENT_OTHER): Payer: Self-pay | Admitting: *Deleted

## 2021-01-12 LAB — BLOOD CULTURE ID PANEL (REFLEXED) - BCID2

## 2021-01-15 LAB — CULTURE, BLOOD (ROUTINE X 2): Culture: NO GROWTH

## 2021-01-16 ENCOUNTER — Telehealth: Payer: Self-pay

## 2021-01-16 NOTE — Telephone Encounter (Signed)
Post ED Visit - Positive Culture Follow-up  Culture report reviewed by antimicrobial stewardship pharmacist: [facility] [x]  , Pharm.D. []  Audie Clear, Pharm.D., BCPS AQ-ID []  , Pharm.D., BCPS []  Celedonio Miyamoto, .D., BCPS []  [place], .D., BCPS, AAHIVP []  Georgina Pillion, Pharm.D., BCPS, AAHIVP []  [address], PharmD, BCPS []  , PharmD, BCPS []  [place], PharmD, BCPS []  [address], PharmD []  , PharmD, BCPS []  Estella Husk, PharmD  Pharmacy Team []  Lysle Pearl, PharmD []  , PharmD []  Phillips Climes, PharmD []  , Rph []  Agapito Games) , PharmD []  Verlan Friends, PharmD []  , PharmD []  Mervyn Gay, PharmD []  , PharmD []  Vinnie Level, PharmD []  [facility], PharmD []  , PharmD []  Len Childs, PharmD   Positive blood culture Treated with Cefdinir and Metronidazole , organism sensitive to the same and no further patient follow-up is required at this time.  01/16/2021, 11:32 AM

## 2021-02-12 NOTE — Progress Notes (Signed)
Chief Complaint  Patient presents with   Hospitalization Follow-up     HPI: Whitney Payne 75 y.o. come in for fu hosp visit from august   Last visit [facility] was April 21 Sen in [facility]  cough getrd 5 20,  weak dizzy  cough tachy vomiting 8 3 , facial cellilitis  and pna lul 8 10 22    Given omnicef  8 10  and metronizazole    to rx pna and dental abscess    Chest and cough  sx went away    stopped pretty quicckly   had gi ss but was able to finish antibiotic   only took 3 days off work and continued.  Currently no cps ob  but has had edema or ankles in day  dec after rest  no pain .  Finally has appt with os dental who takes her insurance this month but no more facial swelling at this time.   Of not in ed had anemia leukocytosis  and abn lfts.    It was advised to fu x ray  currently no resp sx  ROS: See pertinent positives and negatives per HPI.  Takes tylenol many ams   but no nsaids or cox 2  Patient had some mild tachycardia that has improved with fluids/pain control. CT and CXR reviewed. No abscess but has what appears to be infection from dental source. Also with LUL pneumonia. D/w Pharmacist, recommends omnicef and flagyl to cover for dental infection and should mostly cover pneumonia.  She is not hypoxic or in distress.  She has a mildly worse anemia than typical but no signs or symptoms of acute bleeding on discussion with her.  She appears stable for outpatient management and she is already following up with her oral surgeon in 5 days. Past Medical History:  Diagnosis Date   Abnormal EKG    nonspecific ST and T-wave changes   Arthritis    Headache(784.0)    Knee problem    Postmenopausal HRT (hormone replacement therapy)    Recurrent vertigo    neuro eval see mri report    Family History  Problem Relation Age of Onset   Other Mother        died of se of patch narcotic, had artery diesease and amputation   Other Father        collapsed lung- smoker    Social  History   Socioeconomic History   Marital status: Legally Separated    Spouse name: Not on file   Number of children: Not on file   Years of education: Not on file   Highest education level: Not on file  Occupational History   Not on file  Tobacco Use   Smoking status: Former    Types: Cigarettes    Quit date: 10/14/1963    Years since quitting: 57.3   Smokeless tobacco: Never  Vaping Use   Vaping Use: Never used  Substance and Sexual Activity   Alcohol use: Yes    Comment: occasionally   Drug use: No   Sexual activity: Not on file  Other Topics Concern   Not on file  Social History Narrative   Separated   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     albuterol (VENTOLIN HFA) 108 (90 Base) MCG/ACT inhaler Inhale 1-2 puffs into the lungs every 6 (six) hours as needed for wheezing or shortness of breath. 8 g 0   benzonatate (TESSALON) 200 MG capsule Take 1 capsule (200 mg total) by mouth 3 (three) times daily as needed for cough. 20 capsule 0   Calcium Carbonate-Vitamin D (CALCIUM + D PO) Take by mouth.       carbamide peroxide (DEBROX) 6.5 % OTIC solution Place 5 drops into both ears 2 (two) times daily. 15 mL 0   cefdinir (OMNICEF) 300 MG capsule Take 1 capsule (300 mg total) by mouth 2 (two) times daily. 13 capsule 0   cetirizine (ZYRTEC) 10 MG tablet Take 1 tablet (10 mg total) by mouth daily. 30 tablet 11   chlorhexidine (PERIDEX) 0.12 % solution 15 mLs by Mouth Rinse route 2 (two) times daily.     famotidine (PEPCID) 20 MG tablet Take 1 tablet (20 mg  total) by mouth 2 (two) times daily as needed for heartburn or indigestion. 90 tablet 0   metroNIDAZOLE (FLAGYL) 500 MG tablet Take 1 tablet (500 mg total) by mouth 2 (two) times daily. 13 tablet 0   ondansetron (ZOFRAN ODT) 4 MG disintegrating tablet Take 1 tablet (4 mg total) by mouth every 8 (eight) hours as needed for nausea or vomiting. 20 tablet 0   vitamin B-12 (CYANOCOBALAMIN) 100 MCG tablet Take 100 mcg by mouth daily.       ibuprofen (ADVIL) 800 MG tablet Take 1 tablet (800 mg total) by mouth 3 (three) times daily as needed. (Patient not taking: No sig reported) 30 tablet 0   No facility-administered medications prior to visit.     EXAM:  BP 134/88 (BP Location: Left Arm, Patient Position: Sitting, Cuff Size: Normal)   Pulse 87   Temp 98.7 F (37.1 C) (Oral)   Ht 5' 2.08" (1.577 m)   Wt 134 lb 6.4 oz (61 kg)   SpO2 96%   BMI 24.52 kg/m   Body mass index is 24.52 kg/m.  GENERAL: vitals reviewed and listed above, alert, oriented, appears well hydrated and in no acute distress HEENT: atraumatic, conjunctiva  clear, no obvious abnormalities on inspection of external nose and ears OP : masked  NECK: no obvious masses on inspection palpation  LUNGS: clear to auscultation bilaterally, no wheezes, rales or rhonchi, good air movement Abdomen:  Sof,t normal bowel sounds without hepatosplenomegaly, no guarding rebound or masses no CVA tenderness CV: HRRR, no clubbing cyanosis nl cap refill  puffy ankles   MS: moves all extremities without noticeable focal  abnormality PSYCH: pleasant and cooperative, no obvious depression or anxiety Lab Results  Component Value Date   WBC 14.5 (H) 01/10/2021   HGB 9.9 (L) 01/10/2021   HCT 30.4 (L) 01/10/2021   PLT 606 (H) 01/10/2021   GLUCOSE 113 (H) 01/10/2021   CHOL 225 (H) 12/14/2018   TRIG 241.0 (H) 12/14/2018   HDL 65.80 12/14/2018   LDLDIRECT 133.0 12/14/2018   LDLCALC 130 (H) 12/03/2017   ALT 68 (H) 01/10/2021   AST 82 (H)  01/10/2021   NA 136 01/10/2021   K 4.2 01/10/2021   CL 104 01/10/2021   CREATININE 0.87 01/10/2021  BUN 16 01/10/2021   CO2 22 01/10/2021   TSH 0.39 11/13/2016   HGBA1C 6.3 12/14/2018   MICROALBUR <0.7 09/06/2019   BP Readings from Last 3 Encounters:  02/14/21 134/88  01/10/21 131/71  01/03/21 (!) 152/87  IMPRESSION: Inflammatory changes in the right face with soft tissue thickening along the buccal aspect of the right maxilla. Most likely odontogenic with large periapical lucency about the posterior right maxillary molar. No evidence of drainable abscess.     Electronically Signed   By: Guadlupe Spanish M.D.   On: 01/10/2021 10:02 CLINICAL DATA:  cough x 1 month   EXAM: CHEST - 2 VIEW   COMPARISON:  01/07/2020.   FINDINGS: The heart size and mediastinal contours are within normal limits. Left upper lobe consolidation, new from prior. No visible pleural effusions or pneumothorax. No acute osseous abnormality.   IMPRESSION: Left upper lobe consolidation, compatible with pneumonia. Followup PA and lateral chest X-ray is recommended in 3-4 weeks following trial of antibiotic therapy to ensure resolution and exclude underlying malignancy.     Electronically Signed   By: Feliberto Harts MD   On: 01/10/2021 09:21 ASSESSMENT AND PLAN:  Discussed the following assessment and plan:  History of recent pneumonia - Plan: Basic metabolic panel, CBC with Differential/Platelet, Hepatic function panel, TSH, T4, free, Hemoglobin A1c, DG Chest 2 View, Hemoglobin A1c, T4, free, TSH, Hepatic function panel, CBC with Differential/Platelet, Basic metabolic panel  Anemia, unspecified type - Plan: Basic metabolic panel, CBC with Differential/Platelet, Hepatic function panel, TSH, T4, free, Hemoglobin A1c, Hemoglobin A1c, T4, free, TSH, Hepatic function panel, CBC with Differential/Platelet, Basic metabolic panel  Abnormal LFTs - Plan: Basic metabolic panel, CBC with  Differential/Platelet, Hepatic function panel, TSH, T4, free, Hemoglobin A1c, Hemoglobin A1c, T4, free, TSH, Hepatic function panel, CBC with Differential/Platelet, Basic metabolic panel  Medication management - Plan: Basic metabolic panel, CBC with Differential/Platelet, Hepatic function panel, TSH, T4, free, Hemoglobin A1c, Hemoglobin A1c, T4, free, TSH, Hepatic function panel, CBC with Differential/Platelet, Basic metabolic panel  Ankle edema, bilateral - dependent  better with rest   H/O dental abscess - dental os fu pending better for now Get fu labs   and plan c x ray  either  elam or other hand written  scrip given  -Patient advised to return or notify health care team  if  new concerns arise.  Patient Instructions  Glad you a re better ' Blood test fu  today  Need fu chest x ray done to ensure  better.  I fyou have  a fax number for order to be done  we can send order otherwise  handwritten order.   CHEST - 2 VIEW   COMPARISON:  01/07/2020.   FINDINGS: The heart size and mediastinal contours are within normal limits. Left upper lobe consolidation, new from prior. No visible pleural effusions or pneumothorax. No acute osseous abnormality.   IMPRESSION: Left upper lobe consolidation, compatible with pneumonia. Followup PA and lateral chest X-ray is recommended in 3-4 weeks following trial of antibiotic therapy to ensure resolution and exclude underlying malignancy.     Electronically Signed   By: Feliberto Harts MD   On: 01/10/2021 09:21   Neta Mends. Aidin Doane M.D.

## 2021-02-14 ENCOUNTER — Encounter: Payer: Self-pay | Admitting: Internal Medicine

## 2021-02-14 ENCOUNTER — Other Ambulatory Visit: Payer: Self-pay

## 2021-02-14 ENCOUNTER — Ambulatory Visit (INDEPENDENT_AMBULATORY_CARE_PROVIDER_SITE_OTHER): Payer: Medicare Other | Admitting: Internal Medicine

## 2021-02-14 VITALS — BP 134/88 | HR 87 | Temp 98.7°F | Ht 62.08 in | Wt 134.4 lb

## 2021-02-14 DIAGNOSIS — R945 Abnormal results of liver function studies: Secondary | ICD-10-CM

## 2021-02-14 DIAGNOSIS — M25471 Effusion, right ankle: Secondary | ICD-10-CM | POA: Diagnosis not present

## 2021-02-14 DIAGNOSIS — D649 Anemia, unspecified: Secondary | ICD-10-CM | POA: Diagnosis not present

## 2021-02-14 DIAGNOSIS — M25472 Effusion, left ankle: Secondary | ICD-10-CM

## 2021-02-14 DIAGNOSIS — Z8701 Personal history of pneumonia (recurrent): Secondary | ICD-10-CM | POA: Diagnosis not present

## 2021-02-14 DIAGNOSIS — Z8719 Personal history of other diseases of the digestive system: Secondary | ICD-10-CM

## 2021-02-14 DIAGNOSIS — Z79899 Other long term (current) drug therapy: Secondary | ICD-10-CM

## 2021-02-14 DIAGNOSIS — R7989 Other specified abnormal findings of blood chemistry: Secondary | ICD-10-CM

## 2021-02-14 NOTE — Patient Instructions (Addendum)
Glad you a re better ' Blood test fu  today  Need fu chest x ray done to ensure  better.  I fyou have  a fax number for order to be done  we can send order otherwise  handwritten order.   CHEST - 2 VIEW   COMPARISON:  01/07/2020.   FINDINGS: The heart size and mediastinal contours are within normal limits. Left upper lobe consolidation, new from prior. No visible pleural effusions or pneumothorax. No acute osseous abnormality.   IMPRESSION: Left upper lobe consolidation, compatible with pneumonia. Followup PA and lateral chest X-ray is recommended in 3-4 weeks following trial of antibiotic therapy to ensure resolution and exclude underlying malignancy.     Electronically Signed   By: Feliberto Harts MD   On: 01/10/2021 09:21

## 2021-02-15 LAB — CBC WITH DIFFERENTIAL/PLATELET
Basophils Absolute: 0.1 10*3/uL (ref 0.0–0.1)
Basophils Relative: 1.5 % (ref 0.0–3.0)
Eosinophils Absolute: 0.1 10*3/uL (ref 0.0–0.7)
Eosinophils Relative: 1.6 % (ref 0.0–5.0)
HCT: 36.4 % (ref 36.0–46.0)
Hemoglobin: 11.9 g/dL — ABNORMAL LOW (ref 12.0–15.0)
Lymphocytes Relative: 47.1 % — ABNORMAL HIGH (ref 12.0–46.0)
Lymphs Abs: 3 10*3/uL (ref 0.7–4.0)
MCHC: 32.7 g/dL (ref 30.0–36.0)
MCV: 98.2 fl (ref 78.0–100.0)
Monocytes Absolute: 0.9 10*3/uL (ref 0.1–1.0)
Monocytes Relative: 13.8 % — ABNORMAL HIGH (ref 3.0–12.0)
Neutro Abs: 2.3 10*3/uL (ref 1.4–7.7)
Neutrophils Relative %: 36 % — ABNORMAL LOW (ref 43.0–77.0)
Platelets: 375 10*3/uL (ref 150.0–400.0)
RBC: 3.71 Mil/uL — ABNORMAL LOW (ref 3.87–5.11)
RDW: 14.9 % (ref 11.5–15.5)
WBC: 6.5 10*3/uL (ref 4.0–10.5)

## 2021-02-15 LAB — BASIC METABOLIC PANEL
BUN: 11 mg/dL (ref 6–23)
CO2: 26 mEq/L (ref 19–32)
Calcium: 9.7 mg/dL (ref 8.4–10.5)
Chloride: 104 mEq/L (ref 96–112)
Creatinine, Ser: 0.75 mg/dL (ref 0.40–1.20)
GFR: 77.2 mL/min (ref >60.00)
Glucose, Bld: 84 mg/dL (ref 70–99)
Potassium: 4.4 mEq/L (ref 3.5–5.1)
Sodium: 139 mEq/L (ref 135–145)

## 2021-02-15 LAB — HEMOGLOBIN A1C: Hgb A1c MFr Bld: 6.2 % (ref 4.6–6.5)

## 2021-02-15 LAB — HEPATIC FUNCTION PANEL
ALT: 14 U/L (ref 0–35)
AST: 18 U/L (ref 0–37)
Albumin: 3.7 g/dL (ref 3.5–5.2)
Alkaline Phosphatase: 64 U/L (ref 39–117)
Bilirubin, Direct: 0.1 mg/dL (ref 0.0–0.3)
Total Bilirubin: 0.5 mg/dL (ref 0.2–1.2)
Total Protein: 7.4 g/dL (ref 6.0–8.3)

## 2021-02-15 LAB — TSH: TSH: 0.63 u[IU]/mL (ref 0.35–5.50)

## 2021-02-15 LAB — T4, FREE: Free T4: 0.6 ng/dL (ref 0.60–1.60)

## 2021-02-15 NOTE — Progress Notes (Signed)
Blood work abnormalities are much better liver panel is now normal, kidney function normal.  Thyroid now in the normal range Anemia is improved although not yet back to normal.  Her hemoglobin is 11.9  All of this is reassuring but make sure you get your follow-up chest x-ray.

## 2021-02-16 ENCOUNTER — Other Ambulatory Visit: Payer: Self-pay

## 2021-02-16 ENCOUNTER — Ambulatory Visit (INDEPENDENT_AMBULATORY_CARE_PROVIDER_SITE_OTHER)
Admission: RE | Admit: 2021-02-16 | Discharge: 2021-02-16 | Disposition: A | Discharge status: Home / Self Care | Payer: Medicare Other | Source: Ambulatory Visit | Attending: Internal Medicine | Admitting: Internal Medicine

## 2021-02-16 DIAGNOSIS — Z8701 Personal history of pneumonia (recurrent): Secondary | ICD-10-CM | POA: Diagnosis not present

## 2021-02-16 DIAGNOSIS — J9811 Atelectasis: Secondary | ICD-10-CM | POA: Diagnosis not present

## 2021-02-16 NOTE — Progress Notes (Signed)
Inform Ms Renaldo that x ray is improved but not totally better.   Radiologist  advises still needs f/u  imaging .  Please arrange  chest x ray  for another 4 weeks from now( ask her where she wants to get it done and place order)  dx  fu   pneumonia .  No office visit needed if feeling fine .   If not  resolving  we would probably get a chest CT  at that time.

## 2021-02-19 ENCOUNTER — Other Ambulatory Visit: Payer: Self-pay

## 2021-02-19 DIAGNOSIS — J690 Pneumonitis due to inhalation of food and vomit: Secondary | ICD-10-CM

## 2021-06-29 ENCOUNTER — Telehealth: Payer: Self-pay | Admitting: Internal Medicine

## 2021-06-29 NOTE — Telephone Encounter (Signed)
Left message for patient to call back and schedule Medicare Annual Wellness Visit (AWV) either virtually or in office. Left  my jabber number [phone]   AWV-I per [place] 06/03/10  please schedule at anytime with [facility] Nurse Health Advisor 1 or 2   This should be a 45 minute visit.  

## 2021-07-09 ENCOUNTER — Ambulatory Visit (INDEPENDENT_AMBULATORY_CARE_PROVIDER_SITE_OTHER): Payer: Medicare Other

## 2021-07-09 VITALS — Ht 63.0 in | Wt 144.0 lb

## 2021-07-09 DIAGNOSIS — Z0000 Encounter for general adult medical examination without abnormal findings: Secondary | ICD-10-CM

## 2021-07-09 NOTE — Patient Instructions (Signed)
Ms. Shepardson , Thank you for taking time to come for your Medicare Wellness Visit. I appreciate your ongoing commitment to your health goals. Please review the following plan we discussed and let me know if I can assist you in the future.   Screening recommendations/referrals: Colonoscopy: not required Mammogram: patient to schedule Bone Density: completed 09/10/2019 Recommended yearly ophthalmology/optometry visit for glaucoma screening and checkup Recommended yearly dental visit for hygiene and checkup  Vaccinations: Influenza vaccine: completed 05/12/2021, due next flu season Pneumococcal vaccine: due now Tdap vaccine: due now Shingles vaccine:  completed 12/31/2018, 01/12/2020   Covid-19: 11/29/2020, 04/12/2020, 08/18/2019, 07/25/2019  Advanced directives: Please bring a copy of your POA (Power of Attorney) and/or Living Will to your next appointment.   Conditions/risks identified: none  Next appointment: Follow up in one year for your annual wellness visit    Preventive Care 65 Years and Older, Female Preventive care refers to lifestyle choices and visits with your health care provider that can promote health and wellness. What does preventive care include? A yearly physical exam. This is also called an annual well check. Dental exams once or twice a year. Routine eye exams. Ask your health care provider how often you should have your eyes checked. Personal lifestyle choices, including: Daily care of your teeth and gums. Regular physical activity. Eating a healthy diet. Avoiding tobacco and drug use. Limiting alcohol use. Practicing safe sex. Taking low-dose aspirin every day. Taking vitamin and mineral supplements as recommended by your health care provider. What happens during an annual well check? The services and screenings done by your health care provider during your annual well check will depend on your age, overall health, lifestyle risk factors, and family history of  disease. Counseling  Your health care provider may ask you questions about your: Alcohol use. Tobacco use. Drug use. Emotional well-being. Home and relationship well-being. Sexual activity. Eating habits. History of falls. Memory and ability to understand (cognition). Work and work Astronomer. Reproductive health. Screening  You may have the following tests or measurements: Height, weight, and BMI. Blood pressure. Lipid and cholesterol levels. These may be checked every 5 years, or more frequently if you are over 67 years old. Skin check. Lung cancer screening. You may have this screening every year starting at age 54 if you have a 30-pack-year history of smoking and currently smoke or have quit within the past 15 years. Fecal occult blood test (FOBT) of the stool. You may have this test every year starting at age 70. Flexible sigmoidoscopy or colonoscopy. You may have a sigmoidoscopy every 5 years or a colonoscopy every 10 years starting at age 23. Hepatitis C blood test. Hepatitis B blood test. Sexually transmitted disease (STD) testing. Diabetes screening. This is done by checking your blood sugar (glucose) after you have not eaten for a while (fasting). You may have this done every 1-3 years. Bone density scan. This is done to screen for osteoporosis. You may have this done starting at age 20. Mammogram. This may be done every 1-2 years. Talk to your health care provider about how often you should have regular mammograms. Talk with your health care provider about your test results, treatment options, and if necessary, the need for more tests. Vaccines  Your health care provider may recommend certain vaccines, such as: Influenza vaccine. This is recommended every year. Tetanus, diphtheria, and acellular pertussis (Tdap, Td) vaccine. You may need a Td booster every 10 years. Zoster vaccine. You may need this after age 27.  Pneumococcal 13-valent conjugate (PCV13) vaccine. One  dose is recommended after age 38. Pneumococcal polysaccharide (PPSV23) vaccine. One dose is recommended after age 55. Talk to your health care provider about which screenings and vaccines you need and how often you need them. This information is not intended to replace advice given to you by your health care provider. Make sure you discuss any questions you have with your health care provider. Document Released: 06/16/2015 Document Revised: 02/07/2016 Document Reviewed: 03/21/2015 Elsevier Interactive Patient Education  2017 [employer] Prevention in the Home Falls can cause injuries. They can happen to people of all ages. There are many things you can do to make your home safe and to help prevent falls. What can I do on the outside of my home? Regularly fix the edges of walkways and driveways and fix any cracks. Remove anything that might make you trip as you walk through a door, such as a raised step or threshold. Trim any bushes or trees on the path to your home. Use bright outdoor lighting. Clear any walking paths of anything that might make someone trip, such as rocks or tools. Regularly check to see if handrails are loose or broken. Make sure that both sides of any steps have handrails. Any raised decks and porches should have guardrails on the edges. Have any leaves, snow, or ice cleared regularly. Use sand or salt on walking paths during winter. Clean up any spills in your garage right away. This includes oil or grease spills. What can I do in the bathroom? Use night lights. Install grab bars by the toilet and in the tub and shower. Do not use towel bars as grab bars. Use non-skid mats or decals in the tub or shower. If you need to sit down in the shower, use a plastic, non-slip stool. Keep the floor dry. Clean up any water that spills on the floor as soon as it happens. Remove soap buildup in the tub or shower regularly. Attach bath mats securely with double-sided  non-slip rug tape. Do not have throw rugs and other things on the floor that can make you trip. What can I do in the bedroom? Use night lights. Make sure that you have a light by your bed that is easy to reach. Do not use any sheets or blankets that are too big for your bed. They should not hang down onto the floor. Have a firm chair that has side arms. You can use this for support while you get dressed. Do not have throw rugs and other things on the floor that can make you trip. What can I do in the kitchen? Clean up any spills right away. Avoid walking on wet floors. Keep items that you use a lot in easy-to-reach places. If you need to reach something above you, use a strong step stool that has a grab bar. Keep electrical cords out of the way. Do not use floor polish or wax that makes floors slippery. If you must use wax, use non-skid floor wax. Do not have throw rugs and other things on the floor that can make you trip. What can I do with my stairs? Do not leave any items on the stairs. Make sure that there are handrails on both sides of the stairs and use them. Fix handrails that are broken or loose. Make sure that handrails are as long as the stairways. Check any carpeting to make sure that it is firmly attached to the stairs. Fix any  carpet that is loose or worn. Avoid having throw rugs at the top or bottom of the stairs. If you do have throw rugs, attach them to the floor with carpet tape. Make sure that you have a light switch at the top of the stairs and the bottom of the stairs. If you do not have them, ask someone to add them for you. What else can I do to help prevent falls? Wear shoes that: Do not have high heels. Have rubber bottoms. Are comfortable and fit you well. Are closed at the toe. Do not wear sandals. If you use a stepladder: Make sure that it is fully opened. Do not climb a closed stepladder. Make sure that both sides of the stepladder are locked into place. Ask  someone to hold it for you, if possible. Clearly mark and make sure that you can see: Any grab bars or handrails. First and last steps. Where the edge of each step is. Use tools that help you move around (mobility aids) if they are needed. These include: Canes. Walkers. Scooters. Crutches. Turn on the lights when you go into a dark area. Replace any light bulbs as soon as they burn out. Set up your furniture so you have a clear path. Avoid moving your furniture around. If any of your floors are uneven, fix them. If there are any pets around you, be aware of where they are. Review your medicines with your doctor. Some medicines can make you feel dizzy. This can increase your chance of falling. Ask your doctor what other things that you can do to help prevent falls. This information is not intended to replace advice given to you by your health care provider. Make sure you discuss any questions you have with your health care provider. Document Released: 03/16/2009 Document Revised: 10/26/2015 Document Reviewed: 06/24/2014 Elsevier Interactive Patient Education  2017 [employer].

## 2021-07-09 NOTE — Progress Notes (Signed)
I connected with Whitney Payne today by telephone and verified that I am speaking with the correct person using two identifiers. Location patient: home Location provider: work Persons participating in the virtual visit: Whitney Payne, provider.   I discussed the limitations, risks, security and privacy concerns of performing an evaluation and management service by telephone and the availability of in person appointments. I also discussed with the patient that there may be a patient responsible charge related to this service. The patient expressed understanding and verbally consented to this telephonic visit.    Interactive audio and video telecommunications were attempted between this provider and patient, however failed, due to patient having technical difficulties OR patient did not have access to video capability.  We continued and completed visit with audio only.     Vital signs may be patient reported or missing.  Subjective:   Whitney Payne is a 76 y.o. female who presents for Medicare Annual (Subsequent) preventive examination.  Review of Systems     Cardiac Risk Factors include: advanced age (>26men, >89 women)     Objective:    Today's Vitals   07/09/21 1026 07/09/21 1027  Weight: 144 lb (65.3 kg)   Height: 5\' 3"  (1.6 m)   PainSc:  5    Body mass index is 25.51 kg/m.  Advanced Directives 07/09/2021  Does Patient Have a Medical Advance Directive? Yes  Type of 09/06/2021 of [place];Living will  Copy of Healthcare Power of Attorney in Chart? No - copy requested    Current Medications (verified) Outpatient Encounter Medications as of 07/09/2021  Medication Sig   Acetaminophen (TYLENOL 8 HOUR PO) Take by mouth in the morning and at bedtime.   albuterol (VENTOLIN HFA) 108 (90 Base) MCG/ACT inhaler Inhale 1-2 puffs into the lungs every 6 (six) hours as needed for wheezing or shortness of breath.   Calcium Carbonate-Vitamin D (CALCIUM + D PO)  Take by mouth.     carbamide peroxide (DEBROX) 6.5 % OTIC solution Place 5 drops into both ears 2 (two) times daily.   cetirizine (ZYRTEC) 10 MG tablet Take 1 tablet (10 mg total) by mouth daily.   vitamin B-12 (CYANOCOBALAMIN) 100 MCG tablet Take 100 mcg by mouth daily.     benzonatate (TESSALON) 200 MG capsule Take 1 capsule (200 mg total) by mouth 3 (three) times daily as needed for cough. (Patient not taking: Reported on 07/09/2021)   cefdinir (OMNICEF) 300 MG capsule Take 1 capsule (300 mg total) by mouth 2 (two) times daily. (Patient not taking: Reported on 07/09/2021)   chlorhexidine (PERIDEX) 0.12 % solution 15 mLs by Mouth Rinse route 2 (two) times daily. (Patient not taking: Reported on 07/09/2021)   famotidine (PEPCID) 20 MG tablet Take 1 tablet (20 mg total) by mouth 2 (two) times daily as needed for heartburn or indigestion. (Patient not taking: Reported on 07/09/2021)   metroNIDAZOLE (FLAGYL) 500 MG tablet Take 1 tablet (500 mg total) by mouth 2 (two) times daily. (Patient not taking: Reported on 07/09/2021)   ondansetron (ZOFRAN ODT) 4 MG disintegrating tablet Take 1 tablet (4 mg total) by mouth every 8 (eight) hours as needed for nausea or vomiting. (Patient not taking: Reported on 07/09/2021)   No facility-administered encounter medications on file as of 07/09/2021.    Allergies (verified) Penicillins and Tizanidine   History: Past Medical History:  Diagnosis Date   Abnormal EKG    nonspecific ST and T-wave changes   Arthritis    Headache(784.0)  Knee problem    Postmenopausal HRT (hormone replacement therapy)    Recurrent vertigo    neuro eval see mri report   Past Surgical History:  Procedure Laterality Date   ABDOMINAL HYSTERECTOMY     CATARACT EXTRACTION     TUBAL LIGATION     Family History  Problem Relation Age of Onset   Other Mother        died of se of patch narcotic, had artery diesease and amputation   Other Father        collapsed lung- smoker   Social  History   Socioeconomic History   Marital status: Legally Separated    Spouse name: Not on file   Number of children: Not on file   Years of education: Not on file   Highest education level: Not on file  Occupational History   Not on file  Tobacco Use   Smoking status: Former    Types: Cigarettes    Quit date: 10/14/1963    Years since quitting: 57.7   Smokeless tobacco: Never  Vaping Use   Vaping Use: Never used  Substance and Sexual Activity   Alcohol use: Yes    Comment: occasionally   Drug use: No   Sexual activity: Not on file  Other Topics Concern   Not on file  Social History Narrative   Separated   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 work   Exercising ok   Neg tad    Social Determinants of Health   Financial Resource Strain: Medium Risk   Difficulty of Paying Living Expenses: Somewhat hard  Food Insecurity: No Food Insecurity   Worried About Programme researcher, broadcasting/film/video in the Last Year: Never true   Ran Out of Food in the Last Year: Never true  Transportation Needs: No Transportation Needs   Lack of Transportation (Medical): No   Lack of Transportation (Non-Medical): No  Physical Activity: Inactive   Days of Exercise per Week: 0 days   Minutes of Exercise per Session: 0 min  Stress: No Stress Concern Present   Feeling of Stress : Not at all  Social Connections: Not on file    Tobacco Counseling Counseling given: Not Answered   Clinical Intake:  Pre-visit preparation completed: Yes  Pain : 0-10 Pain Score: 5  Pain Type: Chronic pain Pain Location: Knee Pain Orientation: Left, Right Pain Descriptors / Indicators: Aching Pain Onset: More than a month ago Pain Frequency: Constant     Nutritional Status: BMI 25 -29 Overweight Nutritional Risks: None Diabetes: No  How often do you need to have someone help you when you read instructions, pamphlets, or other written materials from your doctor or pharmacy?: 1 - Never What is the last grade  level you completed in school?: some college  Diabetic? no  Interpreter Needed?: No  Information entered by :: NAllen LPN   Activities of Daily Living In your present state of health, do you have any difficulty performing the following activities: 07/09/2021  Hearing? N  Vision? N  Difficulty concentrating or making decisions? N  Walking or climbing stairs? Y  Dressing or bathing? N  Doing errands, shopping? N  Preparing Food and eating ? N  Using the Toilet? N  In the past six months, have you accidently leaked urine? N  Do you have problems with loss of bowel control? N  Managing your Medications? N  Managing your Finances? N  Housekeeping or managing your Housekeeping? N  Some recent  data might be hidden    Patient Care Team: Panosh, Neta Mends, MD as PCP - General Jodi Geralds, MD as Consulting Physician (Orthopedic Surgery)  Indicate any recent Medical Services you may have received from other than [facility] providers in the past year (date may be approximate).     Assessment:   This is a routine wellness examination for [place].  Hearing/Vision screen Vision Screening - Comments:: No regular eye exams,  Dietary issues and exercise activities discussed: Current Exercise Habits: The patient does not participate in regular exercise at present   Goals Addressed             This Visit's Progress    Patient Stated       07/09/2021, wants to lose 5-6 pounds       Depression Screen PHQ 2/9 Scores 07/09/2021 09/06/2019 07/10/2018 12/03/2017 11/13/2016  PHQ - 2 Score 0 0 0 0 0  PHQ- 9 Score - 0 - - -    Fall Risk Fall Risk  07/09/2021 02/14/2021 09/06/2019 07/10/2018 12/03/2017  Falls in the past year? 0 0 0 0 No  Number falls in past yr: - 0 - 0 -  Injury with Fall? - 0 - 0 -  Risk for fall due to : No Fall Risks - - - -  Follow up Falls evaluation completed;Education provided;Falls prevention discussed - - - -    FALL RISK PREVENTION PERTAINING TO THE HOME:  Any stairs in or  around the home? Yes  If so, are there any without handrails? No  Home free of loose throw rugs in walkways, pet beds, electrical cords, etc? Yes  Adequate lighting in your home to reduce risk of falls? Yes   ASSISTIVE DEVICES UTILIZED TO PREVENT FALLS:  Life alert? No  Use of a cane, walker or w/c? No  Grab bars in the bathroom? No  Shower chair or bench in shower? Yes  Elevated toilet seat or a handicapped toilet? Yes   TIMED UP AND GO:  Was the test performed? No .      Cognitive Function:     6CIT Screen 07/09/2021  What Year? 0 points  What month? 0 points  What time? 0 points  Count back from 20 0 points  Months in reverse 4 points  Repeat phrase 0 points  Total Score 4    Immunizations Immunization History  Administered Date(s) Administered   Fluad Quad(high Dose 65+) 05/12/2021   PFIZER(Purple Top)SARS-COV-2 Vaccination 07/25/2019, 08/18/2019, 04/12/2020, 11/29/2020   Zoster Recombinat (Shingrix) 12/31/2018, 01/12/2020    TDAP status: Due, Education has been provided regarding the importance of this vaccine. Advised may receive this vaccine at local pharmacy or Health Dept. Aware to provide a copy of the vaccination record if obtained from local pharmacy or Health Dept. Verbalized acceptance and understanding.  Flu Vaccine status: Up to date  Pneumococcal vaccine status: Due, Education has been provided regarding the importance of this vaccine. Advised may receive this vaccine at local pharmacy or Health Dept. Aware to provide a copy of the vaccination record if obtained from local pharmacy or Health Dept. Verbalized acceptance and understanding.  Covid-19 vaccine status: Completed vaccines  Qualifies for Shingles Vaccine? Yes   Zostavax completed No   Shingrix Completed?: Yes  Screening Tests Health Maintenance  Topic Date Due   Pneumonia Vaccine 84+ Years old (1 - PCV) Never done   TETANUS/TDAP  Never done   COVID-19 Vaccine (5 - Booster for Pfizer  series) 01/24/2021  INFLUENZA VACCINE  Completed   DEXA SCAN  Completed   Hepatitis C Screening  Completed   Zoster Vaccines- Shingrix  Completed   HPV VACCINES  Aged Out    Health Maintenance  Health Maintenance Due  Topic Date Due   Pneumonia Vaccine 4965+ Years old (1 - PCV) Never done   TETANUS/TDAP  Never done   COVID-19 Vaccine (5 - Booster for Pfizer series) 01/24/2021    Colorectal cancer screening: No longer required.   Mammogram status: patient to schedule  Bone Density status: Completed 09/10/2019.   Lung Cancer Screening: (Low Dose CT Chest recommended if Age 84-80 years, 30 pack-year currently smoking OR have quit w/in 15years.) does not qualify.   Lung Cancer Screening Referral: no  Additional Screening:  Hepatitis C Screening: does qualify; Completed 12/03/2017  Vision Screening: Recommended annual ophthalmology exams for early detection of glaucoma and other disorders of the eye. Is the patient up to date with their annual eye exam?  No  Who is the provider or what is the name of the office in which the patient attends annual eye exams? [facility] If pt is not established with a provider, would they like to be referred to a provider to establish care? No .   Dental Screening: Recommended annual dental exams for proper oral hygiene  Community Resource Referral / Chronic Care Management: CRR required this visit?  No   CCM required this visit?  No      Plan:     I have personally reviewed and noted the following in the patients chart:   Medical and social history Use of alcohol, tobacco or illicit drugs  Current medications and supplements including opioid prescriptions.  Functional ability and status Nutritional status Physical activity Advanced directives List of other physicians Hospitalizations, surgeries, and ER visits in previous 12 months Vitals Screenings to include cognitive, depression, and falls Referrals and appointments  In  addition, I have reviewed and discussed with patient certain preventive protocols, quality metrics, and best practice recommendations. A written personalized care plan for preventive services as well as general preventive health recommendations were provided to patient.     Barb Merinoickeah E Laray Rivkin, LPN   1/6/10962/11/2021   Nurse Notes: none  Due to this being a virtual visit, the after visit summary with patients personalized plan was offered to patient via mail or my-chart.  patient was mailed a copy of AVS.

## 2021-07-09 NOTE — Addendum Note (Signed)
Addended by: Elisha Ponder E on: 07/09/2021 11:05 AM   Modules accepted: Orders

## 2021-07-25 DIAGNOSIS — M25561 Pain in right knee: Secondary | ICD-10-CM | POA: Diagnosis not present

## 2021-07-25 DIAGNOSIS — M1711 Unilateral primary osteoarthritis, right knee: Secondary | ICD-10-CM | POA: Diagnosis not present

## 2021-07-31 ENCOUNTER — Telehealth: Payer: Self-pay

## 2021-07-31 NOTE — Telephone Encounter (Signed)
° °  Telephone encounter was:  Unsuccessful.  07/31/2021 Name: Whitney Payne MRN: [id number] DOB: [birth year]  Unsuccessful outbound call made today to assist with:  Financial Difficulties related to [employer] Attempt:  1st Attempt  A HIPAA compliant voice message was left requesting a return call.  Instructed patient to call back at [phone] at their earliest convenience.  LVM on Pt's home phone and Daughter's [place] phone.  [facility] Guide, Embedded Care Coordination [facility]  [place], [place] [address]  Main Phone: [phone]   E-mail: [email or web address]  Website: [email or web address]

## 2021-08-01 ENCOUNTER — Telehealth: Payer: Self-pay

## 2021-08-01 NOTE — Telephone Encounter (Signed)
? ?  Telephone encounter was:  Successful.  ?08/01/2021 ?Name: Whitney Payne MRN: [id number] DOB: 06-13-1945 ? ?Whitney Payne is a 76 y.o. year old female who is a primary care patient of Panosh, Neta Mends, MD . The community resource team was consulted for assistance with Financial Difficulties related to [place] ? ?Care guide performed the following interventions:  Pt advised [place] electric bill is higher than before and she is looking for assistance on that. Pt also advised she will be having knee surgery soon and is interested about finding out more about in-home care facilites. Pt would like documents by mail. ? ?Follow Up Plan:  Care guide will outreach resources to assist patient with finance needs and in-home care needs. ? ?Hessie Knows ?Care Guide, Embedded Care Coordination ?[facility]  Care management  ?[place], [place] [address]  ?Main Phone: [phone]  E-mail: [email or web address]  ?Website: [email or web address] ? ? ? ?

## 2021-08-09 ENCOUNTER — Telehealth: Payer: Self-pay

## 2021-08-09 NOTE — Telephone Encounter (Signed)
? ?  Telephone encounter was:  Successful.  ?08/09/2021 ?Name: BREELLE HOLLYWOOD MRN: [id number] DOB: 08-Jul-1945 ? ?ARLIN SAVONA is a 76 y.o. year old female who is a primary care patient of Panosh, Neta Mends, MD . The community resource team was consulted for assistance with Financial Difficulties related to bills and in-home care ? ?Care guide performed the following interventions:  Advised mailing for financial assistance and in-home care would be sent out today to ensure pt did not have any other request at this time. Pt advised she did. Pt and I agreed we could connect again on 3/20 just to ensure mail has been received.  Pt has been advised of the mailing for assistance with bills and in-home care. Mail Enclosed: DSS Utility Emergency assistance (CIP) information and application, Holiday representative, [employer] assistance program information and application, Aging Gracefully, Development worker, international aid, and In-home/Home [employer]. ? ?Follow Up Plan:  Care guide will follow up with patient by phone over the next few days to ensure mailing was received. ? ?Hessie Knows ?Care Guide, Embedded Care Coordination ?[facility] Health  Care management  ?[place], [place] [address]  ?Main Phone: [phone]  E-mail: [email or web address]  ?Website: [email or web address] ? ? ? ?

## 2021-08-16 ENCOUNTER — Telehealth: Payer: Self-pay

## 2021-08-16 NOTE — Telephone Encounter (Signed)
? ?  Telephone encounter was:  Successful.  ?08/16/2021 ?Name: Whitney Payne MRN: [id number] DOB: Aug 26, 1945 ? ?[name] is a 76 y.o. year old female who is a primary care patient of Panosh, Neta Mends, MD . The community resource team was consulted for assistance with Financial Difficulties related to bills and in-home care ? ?Care guide performed the following interventions:  Patient advised she received all documentation by mail. She advised she is still looking at everything and asked questions about in-home care for medicare. I advised pt on how to find facilites on the documents I sent that accepted medicare.  She advised she will continue to look at everything she will call back, however, she still wants to establish a plan with her doctor first for after surgery when she has it. Patient advised all questions and concerns have been addressed at this time from me. ? ?Follow Up Plan:  No further follow up planned at this time. The patient has been provided with needed resources.  ? ?Hessie Knows ?Care Guide, Embedded Care Coordination ?[facility] Health  Care management  ?[place], [place] [address]  ?Main Phone: [phone]  E-mail: [email or web address]  ?Website: [email or web address] ? ? ? ?

## 2021-09-05 DIAGNOSIS — M170 Bilateral primary osteoarthritis of knee: Secondary | ICD-10-CM | POA: Diagnosis not present

## 2021-09-17 ENCOUNTER — Telehealth: Payer: Self-pay

## 2021-09-17 NOTE — Telephone Encounter (Signed)
Last Ov 02/14/21 ?Pre-op form received and place in folder ?

## 2021-09-19 NOTE — Telephone Encounter (Signed)
Last visit was symptom related September 2022 ?Make patient an appointment with nme  so we can do the preop evaluation. ?

## 2021-09-20 NOTE — Telephone Encounter (Signed)
Spoke with patient to inform of message and patient stated she will have a hard time getting to the office because of her knees and would like to know if the appt could be virtual  ?

## 2021-09-26 NOTE — Telephone Encounter (Signed)
Her surgery  pre evaluation requires in person visit  and they are asking for updated labs also . So let us know best time you could time and we can try working you  in for appt

## 2021-09-27 NOTE — Telephone Encounter (Signed)
Left voicemail informing patient to call the office to schedule office visit  ?

## 2021-10-02 NOTE — Telephone Encounter (Signed)
Atc to call patient and unable to leave voicemail ?

## 2021-10-10 ENCOUNTER — Encounter: Payer: Self-pay | Admitting: Internal Medicine

## 2021-10-10 ENCOUNTER — Ambulatory Visit (INDEPENDENT_AMBULATORY_CARE_PROVIDER_SITE_OTHER): Payer: Medicare Other

## 2021-10-10 ENCOUNTER — Ambulatory Visit (INDEPENDENT_AMBULATORY_CARE_PROVIDER_SITE_OTHER): Payer: Medicare Other | Admitting: Internal Medicine

## 2021-10-10 VITALS — BP 140/86 | HR 101 | Temp 98.7°F | Ht 63.0 in | Wt 138.6 lb

## 2021-10-10 DIAGNOSIS — Z01818 Encounter for other preprocedural examination: Secondary | ICD-10-CM | POA: Diagnosis not present

## 2021-10-10 DIAGNOSIS — M1710 Unilateral primary osteoarthritis, unspecified knee: Secondary | ICD-10-CM

## 2021-10-10 DIAGNOSIS — Z8701 Personal history of pneumonia (recurrent): Secondary | ICD-10-CM | POA: Diagnosis not present

## 2021-10-10 DIAGNOSIS — D649 Anemia, unspecified: Secondary | ICD-10-CM | POA: Diagnosis not present

## 2021-10-10 LAB — URINALYSIS, ROUTINE W REFLEX MICROSCOPIC
Bilirubin Urine: NEGATIVE
Hgb urine dipstick: NEGATIVE
Leukocytes,Ua: NEGATIVE
Nitrite: NEGATIVE
RBC / HPF: NONE SEEN (ref 0–?)
Specific Gravity, Urine: 1.015 (ref 1.000–1.030)
Total Protein, Urine: NEGATIVE
Urine Glucose: NEGATIVE
Urobilinogen, UA: 0.2 (ref 0.0–1.0)
pH: 6 (ref 5.0–8.0)

## 2021-10-10 LAB — PROTIME-INR
INR: 0.9 ratio (ref 0.8–1.0)
Prothrombin Time: 10 s (ref 9.6–13.1)

## 2021-10-10 LAB — COMPREHENSIVE METABOLIC PANEL
ALT: 14 U/L (ref 0–35)
AST: 18 U/L (ref 0–37)
Albumin: 4.5 g/dL (ref 3.5–5.2)
Alkaline Phosphatase: 63 U/L (ref 39–117)
BUN: 17 mg/dL (ref 6–23)
CO2: 27 mEq/L (ref 19–32)
Calcium: 10.2 mg/dL (ref 8.4–10.5)
Chloride: 101 mEq/L (ref 96–112)
Creatinine, Ser: 0.79 mg/dL (ref 0.40–1.20)
GFR: 72.21 mL/min (ref >60.00)
Glucose, Bld: 95 mg/dL (ref 70–99)
Potassium: 4.5 mEq/L (ref 3.5–5.1)
Sodium: 138 mEq/L (ref 135–145)
Total Bilirubin: 0.5 mg/dL (ref 0.2–1.2)
Total Protein: 8 g/dL (ref 6.0–8.3)

## 2021-10-10 LAB — CBC WITH DIFFERENTIAL/PLATELET
Basophils Absolute: 0 10*3/uL (ref 0.0–0.1)
Basophils Relative: 0.4 % (ref 0.0–3.0)
Eosinophils Absolute: 0.1 10*3/uL (ref 0.0–0.7)
Eosinophils Relative: 1 % (ref 0.0–5.0)
HCT: 38.2 % (ref 36.0–46.0)
Hemoglobin: 12.5 g/dL (ref 12.0–15.0)
Lymphocytes Relative: 31.2 % (ref 12.0–46.0)
Lymphs Abs: 1.6 10*3/uL (ref 0.7–4.0)
MCHC: 32.7 g/dL (ref 30.0–36.0)
MCV: 101.3 fl — ABNORMAL HIGH (ref 78.0–100.0)
Monocytes Absolute: 0.6 10*3/uL (ref 0.1–1.0)
Monocytes Relative: 11.8 % (ref 3.0–12.0)
Neutro Abs: 2.8 10*3/uL (ref 1.4–7.7)
Neutrophils Relative %: 55.6 % (ref 43.0–77.0)
Platelets: 305 10*3/uL (ref 150.0–400.0)
RBC: 3.77 Mil/uL — ABNORMAL LOW (ref 3.87–5.11)
RDW: 13.7 % (ref 11.5–15.5)
WBC: 5 10*3/uL (ref 4.0–10.5)

## 2021-10-10 LAB — HEMOGLOBIN A1C: Hgb A1c MFr Bld: 5.9 % (ref 4.6–6.5)

## 2021-10-10 NOTE — Progress Notes (Signed)
? ?Chief Complaint  ?Patient presents with  ? Pre-op Exam  ? ? ?HPI: ?Patient  Whitney Payne  76 y.o. comes in today for pre operative evaluation  for TKA   per Dr Charlann Boxer ?R knee arthritis  In November 29, 2021.   Now had pain all the time  left  not as ba but present  . Had twist injury to start on right .  ? ?Pulm :  no sp sob   ; had pna in fall 22  better but ; never got the second x ray  FU :denies sig sx  no cp sob : ocass cough she thinks after she eats dry toast  . Can go up flight of stairs without difficulty ( x knee)  ? ?CV :NO cp sob    or change in exercise tolerance  ? ?BP  in 130s at home   not checked recently , has been labile in past related to situations.  ? ?Neuro :no weakness numbness  ? ?Heme: No bleeding   bruising easy  ? 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 ?Health Maintenance  ?Topic Date Due  ? Pneumonia Vaccine 56+ Years old (1 - PCV) 04/12/2022 (Originally 06/19/2009)  ? TETANUS/TDAP  04/12/2022 (Originally [birth year]20/1964)  ? COVID-19 Vaccine (5 - Booster for Pfizer series) 04/12/2022 (Originally 01/24/2021)  ? INFLUENZA VACCINE  01/01/2022  ? DEXA SCAN  Completed  ? Hepatitis C Screening  Completed  ? Zoster Vaccines- Shingrix  Completed  ? HPV VACCINES  Aged Out  ? 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  ? ? ? ?ROS:  ? REST of 12 system review negative except as per HPI, no GU sx  ?Hx pna last fall  resolved    was supposed to have a final cxray fu ( 9 22)  or ct if ongoing findings   but never followed up  ?Aug 22 had facial cellulitis prob from apical dental infection  ? ?Past Medical History:  ?Diagnosis Date  ? Abnormal EKG   ? nonspecific ST and T-wave changes  ? Arthritis   ? Headache(784.0)   ? Knee problem   ? Postmenopausal HRT (hormone replacement  therapy)   ? Recurrent vertigo   ? neuro eval see mri report  ? ? ?Past Surgical History:  ?Procedure Laterality Date  ? ABDOMINAL HYSTERECTOMY    ? CATARACT EXTRACTION    ? TUBAL LIGATION    ? ? ?Family History  ?Problem Relation Age of Onset  ? Other Mother   ?     died of se of patch narcotic, had artery diesease and amputation  ? Other Father   ?     collapsed lung- smoker  ? ? ?Social History  ? ?Socioeconomic History  ? Marital status: Legally Separated  ?  Spouse name: Not on file  ? Number of children: Not on file  ? Years of education: Not on file  ? Highest education level: Not on file  ?Occupational History  ? Not on file  ?Tobacco Use  ? Smoking status: Former  ?  Types: Cigarettes  ?  Quit date: 10/14/1963  ?  Years since quitting: 58.0  ? Smokeless tobacco: Never  ?  Vaping Use  ? Vaping Use: Never used  ?Substance and Sexual Activity  ? Alcohol use: Yes  ?  Comment: occasionally  ? Drug use: No  ? Sexual activity: Not on file  ?Other Topics Concern  ? Not on file  ?Social History Narrative  ? Separated  ? 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 Continuing to work  ? Exercising ok  ? Neg tad   ? ?Social Determinants of Health  ? ?Financial Resource Strain: Medium Risk  ? Difficulty of Paying Living Expenses: Somewhat hard  ?Food Insecurity: No Food Insecurity  ? Worried About Programme researcher, broadcasting/film/videounning Out of Food in the Last Year: Never true  ? Ran Out of Food in the Last Year: Never true  ?Transportation Needs: No Transportation Needs  ? Lack of Transportation (Medical): No  ? Lack of Transportation (Non-Medical): No  ?Physical Activity: Inactive  ? Days of Exercise per Week: 0 days  ? Minutes of Exercise per Session: 0 min  ?Stress: No Stress Concern Present  ? Feeling of Stress : Not at all  ?Social Connections: Not on file  ? ? ? ? ? ? ?EXAM: ? ?BP 140/86   Pulse (!) 101   Temp 98.7 ?F (37.1 ?C) (Oral)   Ht 5\' 3"  (1.6 m)   Wt 138 lb 9.6 oz (62.9 kg)   SpO2 95%   BMI 24.55 kg/m?   repeat pulse 90 rr  ? ?Body mass index  is 24.55 kg/m?. ?Wt Readings from Last 3 Encounters:  ?10/10/21 138 lb 9.6 oz (62.9 kg)  ?07/09/21 144 lb (65.3 kg)  ?02/14/21 134 lb 6.4 oz (61 kg)  ? ? ?Physical Exam: ?Vital signs reviewed ?WUJ:WJXBGEN:This is a well-developed well-nourished alert cooperative    who appearsr stated age in no acute distress.  ?HEENT: normocephalic atraumatic , Eyes: PERRL EOM's full, conjunctiva clear, Nares: paten,t no deformity discharge or tenderness., Ears: no deformity EAC's clear TMs with normal landmarks. Masked  ?NECK: supple without masses, thyromegaly or bruits. ?CHEST/PULM:  Clear to auscultation and percussion breath sounds equal no wheeze , rales or rhonchi. No chest wall deformities or tenderness. ?CV: PMI is nondisplaced, S1 S2 no gallops, murmurs, rubs. Peripheral pulses are full without delay.No JVD .  ?ABDOMEN: Bowel sounds normal nontender  No guard or rebound, no hepato splenomegal no CVA tenderness.   ?Extremtities:  No clubbing cyanosis or edema, no acute joint swelling or redness no focal atrophy  right knee chronic changes  joint line tender ?NEURO:  Oriented x3, cranial nerves 3-12 appear to be intact, no obvious focal weakness,gait within normal limits SKIN: No acute rashes normal turgor, color, no bruising or petechiae. ?PSYCH: Oriented, good eye contact, no obvious depression anxiety, cognition and judgment appear normal. ?LN: no cervical axillary adenopathy ? ?Lab Results  ?Component Value Date  ? WBC 5.0 10/10/2021  ? HGB 12.5 10/10/2021  ? HCT 38.2 10/10/2021  ? PLT 305.0 10/10/2021  ? GLUCOSE 95 10/10/2021  ? CHOL 225 (H) 12/14/2018  ? TRIG 241.0 (H) 12/14/2018  ? HDL 65.80 12/14/2018  ? LDLDIRECT 133.0 12/14/2018  ? LDLCALC 130 (H) 12/03/2017  ? ALT 14 10/10/2021  ? AST 18 10/10/2021  ? NA 138 10/10/2021  ? K 4.5 10/10/2021  ? CL 101 10/10/2021  ? CREATININE 0.79 10/10/2021  ? BUN 17 10/10/2021  ? CO2 27 10/10/2021  ? TSH 0.63 02/14/2021  ? INR 0.9 10/10/2021  ? HGBA1C 5.9 10/10/2021  ? MICROALBUR <0.7  09/06/2019  ? ? ?BP  Readings from Last 3 Encounters:  ?10/10/21 140/86  ?02/14/21 134/88  ?01/10/21 131/71  ? ? ?Lab plan  reviewed with patient  ? ?ASSESSMENT AND PLAN: ? ?Discussed the following assessment and plan: ? ?  ICD-10-CM   ?1. Arthritis of knee  M17.10 CBC with Differential/Platelet  ?  Comprehensive metabolic panel  ?  Protime-INR  ?  Hemoglobin A1c  ?  Urinalysis, Routine w reflex microscopic  ?  Urinalysis, Routine w reflex microscopic  ?  Hemoglobin A1c  ?  Protime-INR  ?  Comprehensive metabolic panel  ?  CBC with Differential/Platelet  ?  ?2. Pre-op evaluation  Z01.818 DG Chest 2 View  ?  CBC with Differential/Platelet  ?  Comprehensive metabolic panel  ?  Protime-INR  ?  Hemoglobin A1c  ?  Urinalysis, Routine w reflex microscopic  ?  Urinalysis, Routine w reflex microscopic  ?  Hemoglobin A1c  ?  Protime-INR  ?  Comprehensive metabolic panel  ?  CBC with Differential/Platelet  ?  ?3. History of recent pneumonia 9 2022  Z87.01 DG Chest 2 View  ?  CBC with Differential/Platelet  ?  Comprehensive metabolic panel  ?  Protime-INR  ?  Hemoglobin A1c  ?  Urinalysis, Routine w reflex microscopic  ?  Urinalysis, Routine w reflex microscopic  ?  Hemoglobin A1c  ?  Protime-INR  ?  Comprehensive metabolic panel  ?  CBC with Differential/Platelet  ? over due for fu c xray   ?  ?4. Anemia, unspecified type  D64.9 CBC with Differential/Platelet  ?  Comprehensive metabolic panel  ?  Protime-INR  ?  Hemoglobin A1c  ?  Urinalysis, Routine w reflex microscopic  ?  Urinalysis, Routine w reflex microscopic  ?  Hemoglobin A1c  ?  Protime-INR  ?  Comprehensive metabolic panel  ?  CBC with Differential/Platelet  ?  ?Form to be completed  ?Lab x ray urine ( no sx ) pending . ?Fu c xray from 9 22 needs to be done . ?Return for depending on results. ? ?Patient Care Team: ?Aubry Tucholski, Neta Mends, MD as PCP - General ?Durene Romans, MD as Consulting Physician (Orthopedic Surgery) ?Patient Instructions  ?Lab and chest x ray  today  . ?Exam is good  ?Will send form and results to Dr Boneta Lucks office.  ? ?Check bp at home when   . Goal 130 range   140 acceptable for surgery  ?If on  ongoing elevation consider medication. ? ? ?Neta Mends. Crandall Harvel M.D.

## 2021-10-10 NOTE — Patient Instructions (Signed)
Lab and chest x ray  today . ?Exam is good  ?Will send form and results to Dr Boneta Lucks office.  ? ?Check bp at home when   . Goal 130 range   140 acceptable for surgery  ?If on  ongoing elevation consider medication. ? ? ? ?

## 2021-10-14 NOTE — Progress Notes (Signed)
Anemia is better . Kidney function and  blood sugar is normal. Forwarding results to Dr Alvan Dame .

## 2021-10-14 NOTE — Progress Notes (Signed)
Chest x ray  is better   . No acute findings . For warding  findings to Dr. Charlann Boxer

## 2021-11-09 DIAGNOSIS — M1711 Unilateral primary osteoarthritis, right knee: Secondary | ICD-10-CM | POA: Diagnosis not present

## 2021-11-09 DIAGNOSIS — M25561 Pain in right knee: Secondary | ICD-10-CM | POA: Diagnosis not present

## 2021-11-19 NOTE — Progress Notes (Signed)
DUE TO COVID-19 ONLY  2  VISITOR IS ALLOWED TO COME WITH YOU AND STAY IN THE WAITING ROOM ONLY DURING PRE OP AND PROCEDURE DAY OF SURGERY.  4  VISITOR  MAY VISIT WITH YOU AFTER SURGERY IN YOUR PRIVATE ROOM DURING VISITING HOURS ONLY! YOU MAY HAVE ONE PERSON SPEND THE NITE WITH YOU IN YOUR ROOM AFTER SURGERY.    .    Your procedure is scheduled on:    11/29/21   Report to [facility] Main  Entrance   Report to admitting at      0515            AM DO NOT BRING INSURANCE CARD, PICTURE ID OR WALLET DAY OF SURGERY.      Call this number if you have problems the morning of surgery [phone]    REMEMBER: NO  SOLID FOODS , CANDY, GUM OR MINTS AFTER MIDNITE THE NITE BEFORE SURGERY .       Marland Kitchen CLEAR LIQUIDS UNTIL       0415am          DAY OF SURGERY.      PLEASE FINISH ENSURE DRINK PER SURGEON ORDER  WHICH NEEDS TO BE COMPLETED AT       0415am     MORNING OF SURGERY.       CLEAR LIQUID DIET   Foods Allowed      WATER BLACK COFFEE ( SUGAR OK, NO MILK, CREAM OR CREAMER) REGULAR AND DECAF  TEA ( SUGAR OK NO MILK, CREAM, OR CREAMER) REGULAR AND DECAF  PLAIN JELLO ( NO RED)  FRUIT ICES ( NO RED, NO FRUIT PULP)  POPSICLES ( NO RED)  JUICE- APPLE, WHITE GRAPE AND WHITE CRANBERRY  SPORT DRINK LIKE GATORADE ( NO RED)  CLEAR BROTH ( VEGETABLE , CHICKEN OR BEEF)                                                                     BRUSH YOUR TEETH MORNING OF SURGERY AND RINSE YOUR MOUTH OUT, NO CHEWING GUM CANDY OR MINTS.     Take these medicines the morning of surgery with A SIP OF WATER:  none    DO NOT TAKE ANY DIABETIC MEDICATIONS DAY OF YOUR SURGERY                               You may not have any metal on your body including hair pins and              piercings  Do not wear jewelry, make-up, lotions, powders or perfumes, deodorant             Do not wear nail polish on your fingernails.              IF YOU ARE A FEMALE AND WANT TO SHAVE UNDER ARMS OR LEGS PRIOR TO SURGERY YOU  MUST DO SO AT LEAST 48 HOURS PRIOR TO SURGERY.              Men may shave face and neck.   Do not bring valuables to the hospital. [facility] IS NOT  RESPONSIBLE   FOR VALUABLES.  Contacts, dentures or bridgework may not be worn into surgery.  Leave suitcase in the car. After surgery it may be brought to your room.     Patients discharged the day of surgery will not be allowed to drive home. IF YOU ARE HAVING SURGERY AND GOING HOME THE SAME DAY, YOU MUST HAVE AN ADULT TO DRIVE YOU HOME AND BE WITH YOU FOR 24 HOURS. YOU MAY GO HOME BY TAXI OR UBER OR ORTHERWISE, BUT AN ADULT MUST ACCOMPANY YOU HOME AND STAY WITH YOU FOR 24 HOURS.                Please read over the following fact sheets you were given: _____________________________________________________________________  [facility] - Preparing for Surgery Before surgery, you can play an important role.  Because skin is not sterile, your skin needs to be as free of germs as possible.  You can reduce the number of germs on your skin by washing with CHG (chlorahexidine gluconate) soap before surgery.  CHG is an antiseptic cleaner which kills germs and bonds with the skin to continue killing germs even after washing. Please DO NOT use if you have an allergy to CHG or antibacterial soaps.  If your skin becomes reddened/irritated stop using the CHG and inform your nurse when you arrive at Short Stay. Do not shave (including legs and underarms) for at least 48 hours prior to the first CHG shower.  You may shave your face/neck. Please follow these instructions carefully:  1.  Shower with CHG Soap the night before surgery and the  morning of Surgery.  2.  If you choose to wash your hair, wash your hair first as usual with your  normal  shampoo.  3.  After you shampoo, rinse your hair and body thoroughly to remove the  shampoo.                           4.  Use CHG as you would any other liquid soap.  You can apply chg directly  to the skin  and wash                       Gently with a scrungie or clean washcloth.  5.  Apply the CHG Soap to your body ONLY FROM THE NECK DOWN.   Do not use on face/ open                           Wound or open sores. Avoid contact with eyes, ears mouth and genitals (private parts).                       Wash face,  Genitals (private parts) with your normal soap.             6.  Wash thoroughly, paying special attention to the area where your surgery  will be performed.  7.  Thoroughly rinse your body with warm water from the neck down.  8.  DO NOT shower/wash with your normal soap after using and rinsing off  the CHG Soap.                9.  Pat yourself dry with a clean towel.            10.  Wear clean pajamas.  11.  Place clean sheets on your bed the night of your first shower and do not  sleep with pets. Day of Surgery : Do not apply any lotions/deodorants the morning of surgery.  Please wear clean clothes to the hospital/surgery center.  FAILURE TO FOLLOW THESE INSTRUCTIONS MAY RESULT IN THE CANCELLATION OF YOUR SURGERY PATIENT SIGNATURE_________________________________  NURSE SIGNATURE__________________________________  ________________________________________________________________________

## 2021-11-19 NOTE — Progress Notes (Signed)
Anesthesia Review:  PCP: DR Berniece Andreas- clearance dated 10/11/21- on chart  Cardiologist : Chest x-ray : 11/11/21- 2v  EKG : 01/10/21  Echo : Stress test: Cardiac Cath :  Activity level:  Sleep Study/ CPAP : Fasting Blood Sugar :      / Checks Blood Sugar -- times a day:   Blood Thinner/ Instructions /Last Dose: ASA / Instructions/ Last Dose :   Hgba1c- 10/10/21- 5.9

## 2021-11-23 ENCOUNTER — Encounter (HOSPITAL_COMMUNITY): Payer: Self-pay

## 2021-11-23 ENCOUNTER — Encounter (HOSPITAL_COMMUNITY)
Admission: RE | Admit: 2021-11-23 | Discharge: 2021-11-23 | Disposition: A | Discharge status: Home / Self Care | Payer: Medicare Other | Source: Ambulatory Visit | Attending: Orthopedic Surgery | Admitting: Orthopedic Surgery

## 2021-11-23 ENCOUNTER — Other Ambulatory Visit: Payer: Self-pay

## 2021-11-23 VITALS — BP 170/72 | HR 86 | Temp 98.3°F | Resp 16 | Ht 63.0 in | Wt 137.0 lb

## 2021-11-23 DIAGNOSIS — Z01812 Encounter for preprocedural laboratory examination: Secondary | ICD-10-CM | POA: Insufficient documentation

## 2021-11-23 DIAGNOSIS — M1711 Unilateral primary osteoarthritis, right knee: Secondary | ICD-10-CM | POA: Diagnosis not present

## 2021-11-23 DIAGNOSIS — Z01818 Encounter for other preprocedural examination: Secondary | ICD-10-CM

## 2021-11-23 LAB — CBC
HCT: 36.6 % (ref 36.0–46.0)
Hemoglobin: 12.1 g/dL (ref 12.0–15.0)
MCH: 33.7 pg (ref 26.0–34.0)
MCHC: 33.1 g/dL (ref 30.0–36.0)
MCV: 101.9 fL — ABNORMAL HIGH (ref 80.0–100.0)
Platelets: 296 10*3/uL (ref 150–400)
RBC: 3.59 MIL/uL — ABNORMAL LOW (ref 3.87–5.11)
RDW: 13 % (ref 11.5–15.5)
WBC: 5.5 10*3/uL (ref 4.0–10.5)
nRBC: 0 % (ref 0.0–0.2)

## 2021-11-23 LAB — SURGICAL PCR SCREEN
MRSA, PCR: NEGATIVE
Staphylococcus aureus: NEGATIVE

## 2021-11-28 NOTE — Anesthesia Preprocedure Evaluation (Signed)
Anesthesia Evaluation  Patient identified by MRN, date of birth, ID band Patient awake    Reviewed: Allergy & Precautions, NPO status , Patient's Chart, lab work & pertinent test results  Airway Mallampati: II  TM Distance: >3 FB Neck ROM: Full    Dental no notable dental hx.    Pulmonary neg pulmonary ROS, former smoker,    Pulmonary exam normal breath sounds clear to auscultation       Cardiovascular negative cardio ROS Normal cardiovascular exam Rhythm:Regular Rate:Normal     Neuro/Psych  Headaches, vertigo negative psych ROS   GI/Hepatic negative GI ROS, Neg liver ROS,   Endo/Other  negative endocrine ROS  Renal/GU negative Renal ROS  negative genitourinary   Musculoskeletal  (+) Arthritis , Osteoarthritis,    Abdominal   Peds negative pediatric ROS (+)  Hematology negative hematology ROS (+)   Anesthesia Other Findings   Reproductive/Obstetrics negative OB ROS                            Anesthesia Physical Anesthesia Plan  ASA: 2  Anesthesia Plan: MAC, Regional and Spinal   Post-op Pain Management: Tylenol PO (pre-op)*   Induction: Intravenous  PONV Risk Score and Plan: 2 and Propofol infusion and TIVA  Airway Management Planned: Natural Airway and Simple Face Mask  Additional Equipment: None  Intra-op Plan:   Post-operative Plan:   Informed Consent: I have reviewed the patients History and Physical, chart, labs and discussed the procedure including the risks, benefits and alternatives for the proposed anesthesia with the patient or authorized representative who has indicated his/her understanding and acceptance.     Dental advisory given  Plan Discussed with: CRNA, Anesthesiologist and Surgeon  Anesthesia Plan Comments: (Adductor canal block. Spinal. GA/LMA as backup plan. Norton Blizzard, MD  )       Anesthesia Quick Evaluation

## 2021-11-29 ENCOUNTER — Ambulatory Visit (HOSPITAL_COMMUNITY): Payer: Medicare Other | Admitting: Physician Assistant

## 2021-11-29 ENCOUNTER — Observation Stay (HOSPITAL_COMMUNITY)
Admission: RE | Admit: 2021-11-29 | Discharge: 2021-11-30 | Disposition: A | Discharge status: Home / Self Care | Payer: Medicare Other | Source: Ambulatory Visit | Attending: Orthopedic Surgery | Admitting: Orthopedic Surgery

## 2021-11-29 ENCOUNTER — Encounter (HOSPITAL_COMMUNITY)
Admission: RE | Disposition: A | Discharge status: Home / Self Care | Payer: Self-pay | Source: Ambulatory Visit | Attending: Orthopedic Surgery

## 2021-11-29 ENCOUNTER — Other Ambulatory Visit: Payer: Self-pay

## 2021-11-29 ENCOUNTER — Ambulatory Visit (HOSPITAL_BASED_OUTPATIENT_CLINIC_OR_DEPARTMENT_OTHER): Payer: Medicare Other | Admitting: Certified Registered Nurse Anesthetist

## 2021-11-29 ENCOUNTER — Encounter (HOSPITAL_COMMUNITY): Payer: Self-pay | Admitting: Orthopedic Surgery

## 2021-11-29 DIAGNOSIS — G8918 Other acute postprocedural pain: Secondary | ICD-10-CM | POA: Diagnosis not present

## 2021-11-29 DIAGNOSIS — M1711 Unilateral primary osteoarthritis, right knee: Secondary | ICD-10-CM | POA: Diagnosis not present

## 2021-11-29 DIAGNOSIS — Z96651 Presence of right artificial knee joint: Secondary | ICD-10-CM

## 2021-11-29 DIAGNOSIS — Z01818 Encounter for other preprocedural examination: Secondary | ICD-10-CM

## 2021-11-29 DIAGNOSIS — Z87891 Personal history of nicotine dependence: Secondary | ICD-10-CM | POA: Diagnosis not present

## 2021-11-29 HISTORY — PX: TOTAL KNEE ARTHROPLASTY: SHX125

## 2021-11-29 LAB — TYPE AND SCREEN
ABO/RH(D): O POS
Antibody Screen: NEGATIVE

## 2021-11-29 LAB — ABO/RH: ABO/RH(D): O POS

## 2021-11-29 SURGERY — ARTHROPLASTY, KNEE, TOTAL
Anesthesia: Monitor Anesthesia Care | Site: Knee | Laterality: Right

## 2021-11-29 MED ORDER — POLYETHYLENE GLYCOL 3350 17 G PO PACK: 17.0000 g | PACK | Freq: Every day | ORAL | Status: DC | PRN

## 2021-11-29 MED ORDER — METOCLOPRAMIDE HCL 5 MG/ML IJ SOLN: 5.0000 mg | Freq: Three times a day (TID) | INTRAMUSCULAR | Status: DC | PRN

## 2021-11-29 MED ORDER — HYDROCODONE-ACETAMINOPHEN 5-325 MG PO TABS
1.0000 | ORAL_TABLET | ORAL | Status: DC | PRN
  Administered 2021-11-29 – 2021-11-30 (×4): 2 via ORAL
  Filled 2021-11-29 (×4): qty 2

## 2021-11-29 MED ORDER — ONDANSETRON HCL 4 MG/2ML IJ SOLN: 4.0000 mg | Freq: Once | INTRAMUSCULAR | Status: DC | PRN

## 2021-11-29 MED ORDER — POVIDONE-IODINE 10 % EX SWAB: 2.0000 "application " | Freq: Once | CUTANEOUS | Status: DC

## 2021-11-29 MED ORDER — 0.9 % SODIUM CHLORIDE (POUR BTL) OPTIME
TOPICAL | Status: DC | PRN
  Administered 2021-11-29: 1000 mL

## 2021-11-29 MED ORDER — PHENOL 1.4 % MT LIQD: 1.0000 | OROMUCOSAL | Status: DC | PRN

## 2021-11-29 MED ORDER — CEFAZOLIN SODIUM-DEXTROSE 2-4 GM/100ML-% IV SOLN
2.0000 g | Freq: Four times a day (QID) | INTRAVENOUS | Status: AC
  Administered 2021-11-29 (×2): 2 g via INTRAVENOUS
  Filled 2021-11-29 (×2): qty 100

## 2021-11-29 MED ORDER — BISACODYL 10 MG RE SUPP: 10.0000 mg | Freq: Every day | RECTAL | Status: DC | PRN

## 2021-11-29 MED ORDER — ONDANSETRON HCL 4 MG/2ML IJ SOLN: 4.0000 mg | Freq: Four times a day (QID) | INTRAMUSCULAR | Status: DC | PRN

## 2021-11-29 MED ORDER — CHLORHEXIDINE GLUCONATE 0.12 % MT SOLN
15.0000 mL | Freq: Once | OROMUCOSAL | Status: AC
  Administered 2021-11-29: 15 mL via OROMUCOSAL

## 2021-11-29 MED ORDER — DEXAMETHASONE SODIUM PHOSPHATE 10 MG/ML IJ SOLN
8.0000 mg | Freq: Once | INTRAMUSCULAR | Status: AC
  Administered 2021-11-29: 8 mg via INTRAVENOUS

## 2021-11-29 MED ORDER — ACETAMINOPHEN 325 MG PO TABS: 325.0000 mg | ORAL_TABLET | Freq: Four times a day (QID) | ORAL | Status: DC | PRN

## 2021-11-29 MED ORDER — HYDROMORPHONE HCL 1 MG/ML IJ SOLN: 0.5000 mg | INTRAMUSCULAR | Status: DC | PRN

## 2021-11-29 MED ORDER — ONDANSETRON HCL 4 MG PO TABS: 4.0000 mg | ORAL_TABLET | Freq: Four times a day (QID) | ORAL | Status: DC | PRN

## 2021-11-29 MED ORDER — TRANEXAMIC ACID-NACL 1000-0.7 MG/100ML-% IV SOLN
1000.0000 mg | INTRAVENOUS | Status: AC
  Administered 2021-11-29: 1000 mg via INTRAVENOUS
  Filled 2021-11-29: qty 100

## 2021-11-29 MED ORDER — KETOROLAC TROMETHAMINE 30 MG/ML IJ SOLN
INTRAMUSCULAR | Status: AC
  Filled 2021-11-29: qty 1

## 2021-11-29 MED ORDER — DOCUSATE SODIUM 100 MG PO CAPS
100.0000 mg | ORAL_CAPSULE | Freq: Two times a day (BID) | ORAL | Status: DC
  Administered 2021-11-29 – 2021-11-30 (×3): 100 mg via ORAL
  Filled 2021-11-29 (×3): qty 1

## 2021-11-29 MED ORDER — DEXAMETHASONE SODIUM PHOSPHATE 10 MG/ML IJ SOLN
INTRAMUSCULAR | Status: AC
  Filled 2021-11-29: qty 1

## 2021-11-29 MED ORDER — ALBUTEROL SULFATE (2.5 MG/3ML) 0.083% IN NEBU: 3.0000 mL | INHALATION_SOLUTION | Freq: Four times a day (QID) | RESPIRATORY_TRACT | Status: DC | PRN

## 2021-11-29 MED ORDER — BUPIVACAINE-EPINEPHRINE 0.25% -1:200000 IJ SOLN
INTRAMUSCULAR | Status: AC
  Filled 2021-11-29: qty 1

## 2021-11-29 MED ORDER — ONDANSETRON HCL 4 MG/2ML IJ SOLN
INTRAMUSCULAR | Status: AC
  Filled 2021-11-29: qty 2

## 2021-11-29 MED ORDER — AMISULPRIDE (ANTIEMETIC) 5 MG/2ML IV SOLN: 10.0000 mg | Freq: Once | INTRAVENOUS | Status: DC | PRN

## 2021-11-29 MED ORDER — STERILE WATER FOR IRRIGATION IR SOLN
Status: DC | PRN
  Administered 2021-11-29: 2000 mL

## 2021-11-29 MED ORDER — FENTANYL CITRATE PF 50 MCG/ML IJ SOSY: 25.0000 ug | PREFILLED_SYRINGE | INTRAMUSCULAR | Status: DC | PRN

## 2021-11-29 MED ORDER — LACTATED RINGERS IV SOLN: INTRAVENOUS | Status: DC

## 2021-11-29 MED ORDER — KETOROLAC TROMETHAMINE 30 MG/ML IJ SOLN
INTRAMUSCULAR | Status: DC | PRN
  Administered 2021-11-29: 30 mg via INTRA_ARTICULAR

## 2021-11-29 MED ORDER — CELECOXIB 200 MG PO CAPS
200.0000 mg | ORAL_CAPSULE | Freq: Two times a day (BID) | ORAL | Status: DC
  Administered 2021-11-29 – 2021-11-30 (×3): 200 mg via ORAL
  Filled 2021-11-29 (×3): qty 1

## 2021-11-29 MED ORDER — HYDROCODONE-ACETAMINOPHEN 7.5-325 MG PO TABS: 1.0000 | ORAL_TABLET | ORAL | Status: DC | PRN

## 2021-11-29 MED ORDER — PROPOFOL 10 MG/ML IV BOLUS
INTRAVENOUS | Status: DC | PRN
  Administered 2021-11-29 (×2): 20 mg via INTRAVENOUS

## 2021-11-29 MED ORDER — POVIDONE-IODINE 10 % EX SWAB
2.0000 | Freq: Once | CUTANEOUS | Status: AC
  Administered 2021-11-29: 2 via TOPICAL

## 2021-11-29 MED ORDER — MENTHOL 3 MG MT LOZG: 1.0000 | LOZENGE | OROMUCOSAL | Status: DC | PRN

## 2021-11-29 MED ORDER — METHOCARBAMOL 500 MG PO TABS
500.0000 mg | ORAL_TABLET | Freq: Four times a day (QID) | ORAL | Status: DC | PRN
  Administered 2021-11-29 – 2021-11-30 (×2): 500 mg via ORAL
  Filled 2021-11-29 (×2): qty 1

## 2021-11-29 MED ORDER — DEXAMETHASONE SODIUM PHOSPHATE 10 MG/ML IJ SOLN
10.0000 mg | Freq: Once | INTRAMUSCULAR | Status: AC
  Administered 2021-11-30: 10 mg via INTRAVENOUS
  Filled 2021-11-29: qty 1

## 2021-11-29 MED ORDER — BUPIVACAINE HCL (PF) 0.5 % IJ SOLN
INTRAMUSCULAR | Status: DC | PRN
  Administered 2021-11-29: 30 mL

## 2021-11-29 MED ORDER — ORAL CARE MOUTH RINSE
15.0000 mL | OROMUCOSAL | Status: DC | PRN
Start: 2021-11-29 — End: 2021-11-30

## 2021-11-29 MED ORDER — KETOROLAC TROMETHAMINE 15 MG/ML IJ SOLN
7.5000 mg | Freq: Four times a day (QID) | INTRAMUSCULAR | Status: DC
  Administered 2021-11-29 – 2021-11-30 (×3): 7.5 mg via INTRAVENOUS
  Filled 2021-11-29 (×3): qty 1

## 2021-11-29 MED ORDER — FENTANYL CITRATE (PF) 100 MCG/2ML IJ SOLN
INTRAMUSCULAR | Status: DC | PRN
  Administered 2021-11-29 (×2): 50 ug via INTRAVENOUS

## 2021-11-29 MED ORDER — FERROUS SULFATE 325 (65 FE) MG PO TABS
325.0000 mg | ORAL_TABLET | Freq: Three times a day (TID) | ORAL | Status: DC
  Administered 2021-11-29 – 2021-11-30 (×3): 325 mg via ORAL
  Filled 2021-11-29 (×3): qty 1

## 2021-11-29 MED ORDER — BUPIVACAINE-EPINEPHRINE (PF) 0.25% -1:200000 IJ SOLN
INTRAMUSCULAR | Status: DC | PRN
  Administered 2021-11-29: 30 mL

## 2021-11-29 MED ORDER — METHOCARBAMOL 500 MG IVPB - SIMPLE MED: 500.0000 mg | Freq: Four times a day (QID) | INTRAVENOUS | Status: DC | PRN

## 2021-11-29 MED ORDER — OXYCODONE HCL 5 MG/5ML PO SOLN: 5.0000 mg | Freq: Once | ORAL | Status: DC | PRN

## 2021-11-29 MED ORDER — OXYCODONE HCL 5 MG PO TABS: 5.0000 mg | ORAL_TABLET | Freq: Once | ORAL | Status: DC | PRN

## 2021-11-29 MED ORDER — SODIUM CHLORIDE 0.9 % IV SOLN
INTRAVENOUS | Status: DC
Start: 2021-11-29 — End: 2021-11-30

## 2021-11-29 MED ORDER — ORAL CARE MOUTH RINSE: 15.0000 mL | Freq: Once | OROMUCOSAL | Status: AC

## 2021-11-29 MED ORDER — SODIUM CHLORIDE (PF) 0.9 % IJ SOLN
INTRAMUSCULAR | Status: DC | PRN
  Administered 2021-11-29: 30 mL

## 2021-11-29 MED ORDER — PHENYLEPHRINE HCL-NACL 20-0.9 MG/250ML-% IV SOLN
INTRAVENOUS | Status: DC | PRN
  Administered 2021-11-29: 40 ug/min via INTRAVENOUS

## 2021-11-29 MED ORDER — PROPOFOL 500 MG/50ML IV EMUL
INTRAVENOUS | Status: DC | PRN
  Administered 2021-11-29: 50 ug/kg/min via INTRAVENOUS

## 2021-11-29 MED ORDER — CEFAZOLIN SODIUM-DEXTROSE 2-4 GM/100ML-% IV SOLN
2.0000 g | INTRAVENOUS | Status: AC
  Administered 2021-11-29: 2 g via INTRAVENOUS
  Filled 2021-11-29: qty 100

## 2021-11-29 MED ORDER — PROPOFOL 1000 MG/100ML IV EMUL
INTRAVENOUS | Status: AC
  Filled 2021-11-29: qty 100

## 2021-11-29 MED ORDER — SODIUM CHLORIDE (PF) 0.9 % IJ SOLN
INTRAMUSCULAR | Status: AC
  Filled 2021-11-29: qty 30

## 2021-11-29 MED ORDER — ONDANSETRON HCL 4 MG/2ML IJ SOLN
INTRAMUSCULAR | Status: DC | PRN
  Administered 2021-11-29: 4 mg via INTRAVENOUS

## 2021-11-29 MED ORDER — TRANEXAMIC ACID-NACL 1000-0.7 MG/100ML-% IV SOLN
1000.0000 mg | Freq: Once | INTRAVENOUS | Status: AC
  Administered 2021-11-29: 1000 mg via INTRAVENOUS
  Filled 2021-11-29: qty 100

## 2021-11-29 MED ORDER — DIPHENHYDRAMINE HCL 12.5 MG/5ML PO ELIX: 12.5000 mg | ORAL_SOLUTION | ORAL | Status: DC | PRN

## 2021-11-29 MED ORDER — MELATONIN 5 MG PO TABS: 10.0000 mg | ORAL_TABLET | Freq: Every evening | ORAL | Status: DC | PRN

## 2021-11-29 MED ORDER — METOCLOPRAMIDE HCL 5 MG PO TABS: 5.0000 mg | ORAL_TABLET | Freq: Three times a day (TID) | ORAL | Status: DC | PRN

## 2021-11-29 MED ORDER — BUPIVACAINE IN DEXTROSE 0.75-8.25 % IT SOLN
INTRATHECAL | Status: DC | PRN
  Administered 2021-11-29: 1.6 mL via INTRATHECAL

## 2021-11-29 MED ORDER — ACETAMINOPHEN 500 MG PO TABS
1000.0000 mg | ORAL_TABLET | Freq: Once | ORAL | Status: AC
  Administered 2021-11-29: 1000 mg via ORAL
  Filled 2021-11-29: qty 2

## 2021-11-29 MED ORDER — FENTANYL CITRATE (PF) 100 MCG/2ML IJ SOLN
INTRAMUSCULAR | Status: AC
  Filled 2021-11-29: qty 2

## 2021-11-29 MED ORDER — ASPIRIN 81 MG PO CHEW
81.0000 mg | CHEWABLE_TABLET | Freq: Two times a day (BID) | ORAL | Status: DC
  Administered 2021-11-29 – 2021-11-30 (×2): 81 mg via ORAL
  Filled 2021-11-29 (×2): qty 1

## 2021-11-29 SURGICAL SUPPLY — 59 items
ADH SKN CLS APL DERMABOND .7 (GAUZE/BANDAGES/DRESSINGS) ×1
ATTUNE MED ANAT PAT 32 KNEE (Knees) ×1 IMPLANT
ATTUNE PSFEM RTSZ4 NARCEM KNEE (Femur) ×1 IMPLANT
ATTUNE PSRP INSR SZ4 8 KNEE (Insert) ×1 IMPLANT
BAG COUNTER SPONGE SURGICOUNT (BAG) IMPLANT
BAG SPEC THK2 15X12 ZIP CLS (MISCELLANEOUS)
BAG SPNG CNTER NS LX DISP (BAG)
BAG ZIPLOCK 12X15 (MISCELLANEOUS) IMPLANT
BASEPLATE TIBIAL ROTATING SZ 4 (Knees) ×1 IMPLANT
BLADE SAW SGTL 11.0X1.19X90.0M (BLADE) ×1 IMPLANT
BLADE SAW SGTL 13.0X1.19X90.0M (BLADE) ×3 IMPLANT
BNDG ELASTIC 6X5.8 VLCR STR LF (GAUZE/BANDAGES/DRESSINGS) ×3 IMPLANT
BOWL SMART MIX CTS (DISPOSABLE) ×3 IMPLANT
BSPLAT TIB 4 CMNT ROT PLAT STR (Knees) ×1 IMPLANT
CEMENT HV SMART SET (Cement) ×2 IMPLANT
CUFF TOURN SGL QUICK 34 (TOURNIQUET CUFF) ×2
CUFF TRNQT CYL 34X4.125X (TOURNIQUET CUFF) ×2 IMPLANT
DERMABOND ADVANCED (GAUZE/BANDAGES/DRESSINGS) ×1
DERMABOND ADVANCED .7 DNX12 (GAUZE/BANDAGES/DRESSINGS) ×2 IMPLANT
DRAPE SHEET LG 3/4 BI-LAMINATE (DRAPES) ×3 IMPLANT
DRAPE U-SHAPE 47X51 STRL (DRAPES) ×3 IMPLANT
DRESSING AQUACEL AG SP 3.5X10 (GAUZE/BANDAGES/DRESSINGS) ×2 IMPLANT
DRSG AQUACEL AG ADV 3.5X10 (GAUZE/BANDAGES/DRESSINGS) IMPLANT
DRSG AQUACEL AG SP 3.5X10 (GAUZE/BANDAGES/DRESSINGS) ×2
DURAPREP 26ML APPLICATOR (WOUND CARE) ×6 IMPLANT
ELECT REM PT RETURN 15FT ADLT (MISCELLANEOUS) ×3 IMPLANT
GLOVE BIO SURGEON STRL SZ 6 (GLOVE) ×3 IMPLANT
GLOVE BIOGEL PI IND STRL 6.5 (GLOVE) ×2 IMPLANT
GLOVE BIOGEL PI IND STRL 7.5 (GLOVE) ×2 IMPLANT
GLOVE BIOGEL PI INDICATOR 6.5 (GLOVE) ×1
GLOVE BIOGEL PI INDICATOR 7.5 (GLOVE) ×1
GLOVE ORTHO TXT STRL SZ7.5 (GLOVE) ×6 IMPLANT
GOWN STRL REUS W/ TWL LRG LVL3 (GOWN DISPOSABLE) ×4 IMPLANT
GOWN STRL REUS W/TWL LRG LVL3 (GOWN DISPOSABLE) ×4
HANDPIECE INTERPULSE COAX TIP (DISPOSABLE) ×2
HOLDER FOLEY CATH W/STRAP (MISCELLANEOUS) ×1 IMPLANT
KIT TURNOVER KIT A (KITS) IMPLANT
MANIFOLD NEPTUNE II (INSTRUMENTS) ×3 IMPLANT
NDL SAFETY ECLIPSE 18X1.5 (NEEDLE) IMPLANT
NEEDLE HYPO 18GX1.5 SHARP (NEEDLE)
NS IRRIG 1000ML POUR BTL (IV SOLUTION) ×3 IMPLANT
PACK TOTAL KNEE CUSTOM (KITS) ×3 IMPLANT
PROTECTOR NERVE ULNAR (MISCELLANEOUS) ×3 IMPLANT
SET HNDPC FAN SPRY TIP SCT (DISPOSABLE) ×2 IMPLANT
SET PAD KNEE POSITIONER (MISCELLANEOUS) ×3 IMPLANT
SPIKE FLUID TRANSFER (MISCELLANEOUS) ×6 IMPLANT
SUT MNCRL AB 4-0 PS2 18 (SUTURE) ×3 IMPLANT
SUT STRATAFIX PDS+ 0 24IN (SUTURE) ×3 IMPLANT
SUT VIC AB 1 CT1 36 (SUTURE) ×3 IMPLANT
SUT VIC AB 2-0 CT1 27 (SUTURE) ×4
SUT VIC AB 2-0 CT1 TAPERPNT 27 (SUTURE) ×4 IMPLANT
SYR 3ML LL SCALE MARK (SYRINGE) ×3 IMPLANT
TOWEL GREEN STERILE FF (TOWEL DISPOSABLE) ×3 IMPLANT
TRAY CATH INTERMITTENT SS 16FR (CATHETERS) ×2 IMPLANT
TRAY FOLEY MTR SLVR 16FR STAT (SET/KITS/TRAYS/PACK) ×2 IMPLANT
TRAY FOLEY W/BAG SLVR 14FR LF (SET/KITS/TRAYS/PACK) ×1 IMPLANT
TUBE SUCTION HIGH CAP CLEAR NV (SUCTIONS) ×3 IMPLANT
WATER STERILE IRR 1000ML POUR (IV SOLUTION) ×6 IMPLANT
WRAP KNEE MAXI GEL POST OP (GAUZE/BANDAGES/DRESSINGS) ×3 IMPLANT

## 2021-11-29 NOTE — Anesthesia Procedure Notes (Signed)
Anesthesia Regional Block: Adductor canal block   Pre-Anesthetic Checklist: , timeout performed,  Correct Patient, Correct Site, Correct Laterality,  Correct Procedure, Correct Position, site marked,  Risks and benefits discussed,  Surgical consent,  Pre-op evaluation,  At surgeon's request and post-op pain management  Laterality: Right  Prep: chloraprep       Needles:  Injection technique: Single-shot  Needle Type: Echogenic Stimulator Needle     Needle Length: 10cm  Needle Gauge: 20     Additional Needles:   Procedures:,,,, ultrasound used (permanent image in chart),,    Narrative:  Start time: 11/29/2021 6:40 AM End time: 11/29/2021 6:45 AM Injection made incrementally with aspirations every 5 mL.  Performed by: Personally  Anesthesiologist: Mellody Dance, MD  Additional Notes: Functioning IV was confirmed and monitors were applied.  Sterile prep and drape,hand hygiene and sterile gloves were used. Ultrasound guidance: relevant anatomy identified, needle position confirmed, local anesthetic spread visualized around nerve(s)., vascular puncture avoided. Negative aspiration and negative test dose prior to incremental administration of local anesthetic. The patient tolerated the procedure well.

## 2021-11-29 NOTE — Anesthesia Procedure Notes (Addendum)
Spinal  Patient location during procedure: OR Start time: 11/29/2021 7:22 AM End time: 11/29/2021 7:26 AM Reason for block: surgical anesthesia Staffing Performed: resident/CRNA  Resident/CRNA: Claudia Desanctis, CRNA Performed by: Claudia Desanctis, CRNA Authorized by: Merlinda Frederick, MD   Preanesthetic Checklist Completed: patient identified, IV checked, site marked, risks and benefits discussed, surgical consent, monitors and equipment checked, pre-op evaluation and timeout performed Spinal Block Patient position: sitting Prep: DuraPrep Patient monitoring: heart rate, cardiac monitor, continuous pulse ox and blood pressure Approach: midline Location: L3-4 Injection technique: single-shot Needle Needle type: Pencan  Needle gauge: 24 G Needle length: 10 cm Needle insertion depth: 8 cm Assessment Sensory level: T4 Events: CSF return Additional Notes IV functioning, monitors applied to pt. Expiration date of kit checked and confirmed to be in date. Sterile prep and drape, hand hygiene and sterile gloved used. Pt was positioned and spine was prepped in sterile fashion. Skin was anesthetized with lidocaine. Free flow of clear CSF obtained prior to injecting local anesthetic into CSF x 1 attempt. Spinal needle aspirated freely following injection. Needle was carefully withdrawn, and pt tolerated procedure well. Loss of motor and sensory on exam post injection.

## 2021-11-29 NOTE — Op Note (Signed)
NAME:  Whitney Payne                      MEDICAL RECORD NO.:  [id number]                             FACILITY:  [facility]      PHYSICIAN:  Madlyn Frankel. Charlann Boxer, M.D.  DATE OF BIRTH:  21-Mar-1946      DATE OF PROCEDURE:  11/29/2021                                     OPERATIVE REPORT         PREOPERATIVE DIAGNOSIS:  Right knee osteoarthritis.      POSTOPERATIVE DIAGNOSIS:  Right knee osteoarthritis.      FINDINGS:  The patient was noted to have complete loss of cartilage and   bone-on-bone arthritis with associated osteophytes in the medial and patellofemoral compartments of   the knee with a significant synovitis and associated effusion.  The patient had failed months of conservative treatment including medications, injection therapy, activity modification.     PROCEDURE:  Right total knee replacement.      COMPONENTS USED:  DePuy Attune rotating platform posterior stabilized knee   system, a size 4N femur, 4 tibia, size 8 mm PS AOX insert, and 32 anatomic patellar   button.      SURGEON:  Madlyn Frankel. Charlann Boxer, M.D.      ASSISTANT:  Rosalene Billings, PA-C.      ANESTHESIA:  Regional and Spinal.      SPECIMENS:  None.      COMPLICATION:  None.      DRAINS:  None.  EBL: <100 cc      TOURNIQUET TIME:   Total Tourniquet Time Documented: Thigh (Right) - 31 minutes Total: Thigh (Right) - 31 minutes  .      The patient was stable to the recovery room.      INDICATION FOR PROCEDURE:  Whitney Payne is a 76 y.o. female patient of   mine.  The patient had been seen, evaluated, and treated for months conservatively in the   office with medication, activity modification, and injections.  The patient had   radiographic changes of bone-on-bone arthritis with endplate sclerosis and osteophytes noted.  Based on the radiographic changes and failed conservative measures, the patient   decided to proceed with definitive treatment, total knee replacement.  Risks of infection, DVT, component  failure, need for revision surgery, neurovascular injury were reviewed in the office setting.  The postop course was reviewed stressing the efforts to maximize post-operative satisfaction and function.  Consent was obtained for benefit of pain   relief.      PROCEDURE IN DETAIL:  The patient was brought to the operative theater.   Once adequate anesthesia, preoperative antibiotics, 2 gm of Ancef,1 gm of Tranexamic Acid, and 10 mg of Decadron administered, the patient was positioned supine with a right thigh tourniquet placed.  The  right lower extremity was prepped and draped in sterile fashion.  A time-   out was performed identifying the patient, planned procedure, and the appropriate extremity.      The right lower extremity was placed in the [facility] leg holder.  The leg was   exsanguinated, tourniquet elevated to 250 mmHg.  A midline incision was  made followed by median parapatellar arthrotomy.  Following initial   exposure, attention was first directed to the patella.  Precut   measurement was noted to be 22 mm.  I resected down to 13 mm and used a   32 anatomic patellar button to restore patellar height as well as cover the cut surface.      The lug holes were drilled and a metal shim was placed to protect the   patella from retractors and saw blade during the procedure.      At this point, attention was now directed to the femur.  The femoral   canal was opened with a drill, irrigated to try to prevent fat emboli.  An   intramedullary rod was passed at 3 degrees valgus, 9 mm of bone was   resected off the distal femur.  Following this resection, the tibia was   subluxated anteriorly.  Using the extramedullary guide, 2 mm of bone was resected off   the proximal medial tibia.  We confirmed the gap would be   stable medially and laterally with a size 6 spacer block as well as confirmed that the tibial cut was perpendicular in the coronal plane, checking with an alignment rod.      Once  this was done, I sized the femur to be a size 4 in the anterior-   posterior dimension, chose a narrow component based on medial and   lateral dimension.  The size 4 rotation block was then pinned in   position anterior referenced using the C-clamp to set rotation.  The   anterior, posterior, and  chamfer cuts were made without difficulty nor   notching making certain that I was along the anterior cortex to help   with flexion gap stability.      The final box cut was made off the lateral aspect of distal femur.      At this point, the tibia was sized to be a size 4.  The size 4 tray was   then pinned in position through the medial third of the tubercle,   drilled, and keel punched.  Trial reduction was now carried with a 4 femur,  4 tibia, a size 8 mm PS insert, and the 32 anatomic patella botton.  The knee was brought to full extension with good flexion stability with the patella   tracking through the trochlea without application of pressure.  Given   all these findings the trial components removed.  Final components were   opened and cement was mixed.  The knee was irrigated with normal saline solution and pulse lavage.  The synovial lining was   then injected with 30 cc of 0.25% Marcaine with epinephrine, 1 cc of Toradol and 30 cc of NS for a total of 61 cc.     Final implants were then cemented onto cleaned and dried cut surfaces of bone with the knee brought to extension with a size 8 mm PS trial insert.      Once the cement had fully cured, excess cement was removed   throughout the knee.  I confirmed that I was satisfied with the range of   motion and stability, and the final size 8 mm PS AOX insert was chosen.  It was   placed into the knee.      The tourniquet had been let down at 31 minutes.  No significant   hemostasis was required.  The extensor mechanism was then reapproximated using #1 Vicryl  and #1 Stratafix sutures with the knee   in flexion.  The   remaining wound was  closed with 2-0 Vicryl and running 4-0 Monocryl.   The knee was cleaned, dried, dressed sterilely using Dermabond and   Aquacel dressing.  The patient was then   brought to recovery room in stable condition, tolerating the procedure   well.   Please note that Physician Assistant, Rosalene Billings, PA-C was present for the entirety of the case, and was utilized for pre-operative positioning, peri-operative retractor management, general facilitation of the procedure and for primary wound closure at the end of the case.              Madlyn Frankel Charlann Boxer, M.D.    11/29/2021 8:43 AM

## 2021-11-29 NOTE — H&P (Signed)
TOTAL KNEE ADMISSION H&P  Patient is being admitted for right total knee arthroplasty.  Subjective:  Chief Complaint:right knee pain.  HPI: Whitney Payne, 76 y.o. female, has a history of pain and functional disability in the right knee due to arthritis and has failed non-surgical conservative treatments for greater than 12 weeks to includeNSAID's and/or analgesics, corticosteriod injections, and activity modification.  Onset of symptoms was gradual, starting 2 years ago with gradually worsening course since that time. The patient noted no past surgery on the right knee(s).  Patient currently rates pain in the right knee(s) at 8 out of 10 with activity. Patient has worsening of pain with activity and weight bearing, pain that interferes with activities of daily living, and pain with passive range of motion.  Patient has evidence of joint space narrowing by imaging studies. There is no active infection.  Patient Active Problem List   Diagnosis Date Noted   Abnormal EKG 06/09/2015   Lymph node enlargement ?  on mri scan of head incidental finding  11/05/2013   Knee pain 11/05/2013   Dizziness and giddiness 05/14/2013   Menopausal hot flushes 07/08/2012   Medication management 07/08/2012   EPICONDYLITIS, RIGHT 08/25/2008   INCREASED BLOOD PRESSURE 08/25/2008   SLEEPLESSNESS 11/27/2007   BACK PAIN 01/20/2007   CHEST PAIN 01/20/2007   THYROID STIMULATING HORMONE, ABNORMAL 01/19/2007   HEADACHE 01/19/2007   Past Medical History:  Diagnosis Date   Abnormal EKG    nonspecific ST and T-wave changes   Arthritis    Knee problem    Postmenopausal HRT (hormone replacement therapy)    Recurrent vertigo    neuro eval see mri report    Past Surgical History:  Procedure Laterality Date   ABDOMINAL HYSTERECTOMY     CATARACT EXTRACTION     left knee arthroscopy      TUBAL LIGATION      Current Facility-Administered Medications  Medication Dose Route Frequency Provider Last Rate Last  Admin   ceFAZolin (ANCEF) IVPB 2g/100 mL premix  2 g Intravenous On Call to OR Cassandria Anger, PA-C       dexamethasone (DECADRON) injection 8 mg  8 mg Intravenous Once Cassandria Anger, PA-C       lactated ringers infusion   Intravenous Continuous Rosalene Billings R, PA-C       lactated ringers infusion   Intravenous Continuous Kaylyn Layer, MD 10 mL/hr at 11/29/21 0616 New Bag at 11/29/21 0616   tranexamic acid (CYKLOKAPRON) IVPB 1,000 mg  1,000 mg Intravenous To OR Cassandria Anger, PA-C       Allergies  Allergen Reactions   Penicillins Itching and Swelling   Tizanidine Itching, Rash and Other (See Comments)    Feels sick    Social History   Tobacco Use   Smoking status: Former    Types: Cigarettes    Quit date: 10/14/1963    Years since quitting: 58.1   Smokeless tobacco: Never  Substance Use Topics   Alcohol use: Yes    Comment: occasionally    Family History  Problem Relation Age of Onset   Other Mother        died of se of patch narcotic, had artery diesease and amputation   Other Father        collapsed lung- smoker     Review of Systems  Constitutional:  Negative for chills and fever.  Respiratory:  Negative for cough and shortness of breath.   Cardiovascular:  Negative for  chest pain.  Gastrointestinal:  Negative for nausea and vomiting.  Musculoskeletal:  Positive for arthralgias.     Objective:  Physical Exam Well nourished and well developed. General: Alert and oriented x3, cooperative and pleasant, no acute distress. Head: normocephalic, atraumatic, neck supple. Eyes: EOMI.  Musculoskeletal: Right knee exam: Neutral to slight varus right knee whereas her left knee is neutral to slight valgus She has no palpable effusion, warmth or erythema in either knee She has tenderness on the right knee medial and anterior Slight flexion contracture with flexion close to 120 degrees with terminal tightness Stable medial lateral collateral  ligaments  Calves soft and nontender. Motor function intact in LE. Strength 5/5 LE bilaterally. Neuro: Distal pulses 2+. Sensation to light touch intact in LE.  Vital signs in last 24 hours: Temp:  [97.9 F (36.6 C)] 97.9 F (36.6 C) (06/29 0544) Pulse Rate:  [100] 100 (06/29 0544) Resp:  [15] 15 (06/29 0544) BP: (163)/(90) 163/90 (06/29 0544) SpO2:  [97 %] 97 % (06/29 0544) Weight:  [62.1 kg] 62.1 kg (06/29 0605)  Labs:   Estimated body mass index is 24.27 kg/m as calculated from the following:   Height as of this encounter: 5\' 3"  (1.6 m).   Weight as of this encounter: 62.1 kg.   Imaging Review Plain radiographs demonstrate severe degenerative joint disease of the right knee(s). The overall alignment isneutral. The bone quality appears to be adequate for age and reported activity level.      Assessment/Plan:  End stage arthritis, right knee   The patient history, physical examination, clinical judgment of the provider and imaging studies are consistent with end stage degenerative joint disease of the right knee(s) and total knee arthroplasty is deemed medically necessary. The treatment options including medical management, injection therapy arthroscopy and arthroplasty were discussed at length. The risks and benefits of total knee arthroplasty were presented and reviewed. The risks due to aseptic loosening, infection, stiffness, patella tracking problems, thromboembolic complications and other imponderables were discussed. The patient acknowledged the explanation, agreed to proceed with the plan and consent was signed. Patient is being admitted for inpatient treatment for surgery, pain control, PT, OT, prophylactic antibiotics, VTE prophylaxis, progressive ambulation and ADL's and discharge planning. The patient is planning to be discharged  home.   Therapy Plans: outpatient therapy at [facility] Disposition: Home with daughter Planned DVT Prophylaxis: aspirin 81mg  BID DME needed:  walker PCP: Dr. , clearance received TXA: IV Allergies: PCN - hives Anesthesia Concerns: none BMI: 24.6 Last HgbA1c: Not diabetic   Other: - Staying overnight - Hydrocodone, robaxin, tylenol, celebrex - Hallucinated with oxycodone after a knee scope  Patient's anticipated LOS is less than 2 midnights, meeting these requirements: - Younger than 78 - Lives within 1 hour of care - Has a competent adult at home to recover with post-op recover - NO history of  - Chronic pain requiring opiods  - Diabetes  - Coronary Artery Disease  - Heart failure  - Heart attack  - Stroke  - DVT/VTE  - Cardiac arrhythmia  - Respiratory Failure/COPD  - Renal failure  - Anemia  - Advanced Liver disease  Berniece Andreas, PA-C Orthopedic Surgery [facility] [place] [phone]

## 2021-11-29 NOTE — Interval H&P Note (Signed)
History and Physical Interval Note:  11/29/2021 7:13 AM  Whitney Payne  has presented today for surgery, with the diagnosis of Right knee osteoarthritis.  The various methods of treatment have been discussed with the patient and family. After consideration of risks, benefits and other options for treatment, the patient has consented to  Procedure(s): TOTAL KNEE ARTHROPLASTY (Right) as a surgical intervention.  The patient's history has been reviewed, patient examined, no change in status, stable for surgery.  I have reviewed the patient's chart and labs.  Questions were answered to the patient's satisfaction.     [name]

## 2021-11-29 NOTE — Transfer of Care (Signed)
Immediate Anesthesia Transfer of Care Note  Patient: YANIRA TOLSMA  Procedure(s) Performed: TOTAL KNEE ARTHROPLASTY (Right: Knee)  Patient Location: PACU  Anesthesia Type:Spinal  Level of Consciousness: awake, alert , oriented and patient cooperative  Airway & Oxygen Therapy: Patient Spontanous Breathing and Patient connected to face mask  Post-op Assessment: Report given to RN and Post -op Vital signs reviewed and stable  Post vital signs: Reviewed and stable  Last Vitals:  Vitals Value Taken Time  BP    Temp    Pulse 80 11/29/21 0903  Resp 13 11/29/21 0903  SpO2 95 % 11/29/21 0903  Vitals shown include unvalidated device data.  Last Pain:  Vitals:   11/29/21 0605  TempSrc:   PainSc: 6       Patients Stated Pain Goal: 4 (11/29/21 2023)  Complications: No notable events documented.

## 2021-11-29 NOTE — Discharge Instructions (Signed)

## 2021-11-29 NOTE — Anesthesia Postprocedure Evaluation (Signed)
Anesthesia Post Note  Patient: Whitney Payne  Procedure(s) Performed: TOTAL KNEE ARTHROPLASTY (Right: Knee)     Anesthesia Type: Spinal Level of consciousness: awake Pain management: pain level controlled Vital Signs Assessment: post-procedure vital signs reviewed and stable Respiratory status: spontaneous breathing and respiratory function stable Cardiovascular status: stable Postop Assessment: no apparent nausea or vomiting Anesthetic complications: no   No notable events documented.  Last Vitals:  Vitals:   11/29/21 1045 11/29/21 1106  BP: 135/85 (!) 147/90  Pulse: 75 73  Resp: 14   Temp: (!) 36.4 C   SpO2: 93%     Last Pain:  Vitals:   11/29/21 1045  TempSrc:   PainSc: 0-No pain                 Mellody Dance

## 2021-11-29 NOTE — Evaluation (Signed)
Physical Therapy Evaluation Patient Details Name: Whitney Payne MRN: [id number] DOB: 12-Jun-1945 Today's Date: 11/29/2021  History of Present Illness  76 yo female S/P Right TKA 11/29/2021. NIO:[id number],  Clinical Impression   Patient able to mobilize and take several steps to recliner using Rw. Reports pain ramping up in right knee.Has  been premedicated. Applied ice to rt knee.  Patient plans to DC to her home with daughter assisting. Pt admitted with above diagnosis.  Pt currently with functional limitations due to the deficits listed below (see PT Problem List). Pt will benefit from skilled PT to increase their independence and safety with mobility to allow discharge to the venue listed below.          Recommendations for follow up therapy are one component of a multi-disciplinary discharge planning process, led by the attending physician.  Recommendations may be updated based on patient status, additional functional criteria and insurance authorization.  Follow Up Recommendations Follow physician's recommendations for discharge plan and follow up therapies      Assistance Recommended at Discharge Intermittent Supervision/Assistance  Patient can return home with the following  A little help with walking and/or transfers;Assistance with cooking/housework;Assist for transportation;Help with stairs or ramp for entrance;A little help with bathing/dressing/bathroom    Equipment Recommendations Rolling walker (2 wheels)  Recommendations for Other Services       Functional Status Assessment Patient has had a recent decline in their functional status and demonstrates the ability to make significant improvements in function in a reasonable and predictable amount of time.     Precautions / Restrictions Precautions Precautions: Fall;Knee      Mobility  Bed Mobility Overal bed mobility: Needs Assistance Bed Mobility: Supine to Sit     Supine to sit: Min assist     General bed  mobility comments: support rightleg    Transfers Overall transfer level: Needs assistance Equipment used: Rolling walker (2 wheels) Transfers: Sit to/from Stand Sit to Stand: Min assist           General transfer comment: cues for hand position    Ambulation/Gait Ambulation/Gait assistance: Min assist Gait Distance (Feet): 5 Feet Assistive device: Rolling walker (2 wheels) Gait Pattern/deviations: Step-to pattern, Antalgic       General Gait Details: only to recliner  Stairs            Wheelchair Mobility    Modified Rankin (Stroke Patients Only)       Balance Overall balance assessment: Mild deficits observed, not formally tested                                           Pertinent Vitals/Pain Pain Assessment Pain Assessment: 0-10 Pain Score: 5  Pain Location: right knee Pain Descriptors / Indicators: Aching, Discomfort, Grimacing Pain Intervention(s): Premedicated before session, Monitored during session, Limited activity within patient's tolerance, Ice applied    Home Living Family/patient expects to be discharged to:: Private residence Living Arrangements: Other relatives Available Help at Discharge: Family;Available 24 hours/day Type of Home: House Home Access: Level entry     Alternate Level Stairs-Number of Steps: 2 to get to BR Home Layout: Multi-level Home Equipment: None      Prior Function Prior Level of Function : Independent/Modified Independent                     Hand Dominance   Dominant  Hand: Right    Extremity/Trunk Assessment   Upper Extremity Assessment Upper Extremity Assessment: Overall WFL for tasks assessed    Lower Extremity Assessment Lower Extremity Assessment: RLE deficits/detail RLE Deficits / Details: flexion to 50, WB in standing, slight buckling    Cervical / Trunk Assessment Cervical / Trunk Assessment: Normal  Communication   Communication: No difficulties  Cognition  Arousal/Alertness: Awake/alert Behavior During Therapy: WFL for tasks assessed/performed Overall Cognitive Status: Within Functional Limits for tasks assessed                                          General Comments      Exercises Total Joint Exercises Ankle Circles/Pumps: AROM, Both, 5 reps Quad Sets: AROM, Both, 5 reps   Assessment/Plan    PT Assessment Patient needs continued PT services  PT Problem List Decreased strength;Decreased mobility;Decreased safety awareness;Decreased range of motion;Decreased knowledge of precautions;Decreased activity tolerance;Decreased balance;Decreased knowledge of use of DME;Pain       PT Treatment Interventions DME instruction;Therapeutic activities;Gait training;Therapeutic exercise;Patient/family education;Functional mobility training;Stair training    PT Goals (Current goals can be found in the Care Plan section)  Acute Rehab PT Goals Patient Stated Goal: go home PT Goal Formulation: With patient Time For Goal Achievement: 11/29/21 Potential to Achieve Goals: Good    Frequency 7X/week     Co-evaluation               AM-PAC PT "6 Clicks" Mobility  Outcome Measure Help needed turning from your back to your side while in a flat bed without using bedrails?: A Little Help needed moving from lying on your back to sitting on the side of a flat bed without using bedrails?: A Little Help needed moving to and from a bed to a chair (including a wheelchair)?: A Lot Help needed standing up from a chair using your arms (e.g., wheelchair or bedside chair)?: A Lot Help needed to walk in hospital room?: A Lot Help needed climbing 3-5 steps with a railing? : A Lot 6 Click Score: 14    End of Session Equipment Utilized During Treatment: Gait belt Activity Tolerance: Patient tolerated treatment well Patient left: in chair;with call bell/phone within reach;with chair alarm set Nurse Communication: Mobility status PT Visit  Diagnosis: Unsteadiness on feet (R26.81);Difficulty in walking, not elsewhere classified (R26.2);Pain Pain - Right/Left: Right Pain - part of body: Knee    Time: 1430-1445 PT Time Calculation (min) (ACUTE ONLY): 15 min   Charges:   PT Evaluation $PT Eval Low Complexity: 1 Low          Blanchard Kelch PT Acute Rehabilitation Services Office [phone] Weekend [phone]   Rada Hay 11/29/2021, 4:05 PM

## 2021-11-29 NOTE — Anesthesia Procedure Notes (Signed)
Procedure Name: MAC Date/Time: 11/29/2021 7:23 AM  Performed by: Claudia Desanctis, CRNAPre-anesthesia Checklist: Patient identified, Emergency Drugs available, Suction available and Patient being monitored Patient Re-evaluated:Patient Re-evaluated prior to induction Oxygen Delivery Method: Simple face mask

## 2021-11-30 ENCOUNTER — Encounter (HOSPITAL_COMMUNITY): Payer: Self-pay | Admitting: Orthopedic Surgery

## 2021-11-30 DIAGNOSIS — Z96651 Presence of right artificial knee joint: Secondary | ICD-10-CM | POA: Diagnosis not present

## 2021-11-30 DIAGNOSIS — M1711 Unilateral primary osteoarthritis, right knee: Secondary | ICD-10-CM | POA: Diagnosis not present

## 2021-11-30 DIAGNOSIS — Z87891 Personal history of nicotine dependence: Secondary | ICD-10-CM | POA: Diagnosis not present

## 2021-11-30 LAB — BASIC METABOLIC PANEL
Anion gap: 6 (ref 5–15)
BUN: 15 mg/dL (ref 8–23)
CO2: 24 mmol/L (ref 22–32)
Calcium: 8.9 mg/dL (ref 8.9–10.3)
Chloride: 110 mmol/L (ref 98–111)
Creatinine, Ser: 0.91 mg/dL (ref 0.44–1.00)
GFR, Estimated: >60 mL/min (ref >60)
Glucose, Bld: 141 mg/dL — ABNORMAL HIGH (ref 70–99)
Potassium: 4.5 mmol/L (ref 3.5–5.1)
Sodium: 140 mmol/L (ref 135–145)

## 2021-11-30 LAB — CBC
HCT: 29.5 % — ABNORMAL LOW (ref 36.0–46.0)
Hemoglobin: 9.7 g/dL — ABNORMAL LOW (ref 12.0–15.0)
MCH: 33.3 pg (ref 26.0–34.0)
MCHC: 32.9 g/dL (ref 30.0–36.0)
MCV: 101.4 fL — ABNORMAL HIGH (ref 80.0–100.0)
Platelets: 253 10*3/uL (ref 150–400)
RBC: 2.91 MIL/uL — ABNORMAL LOW (ref 3.87–5.11)
RDW: 12.7 % (ref 11.5–15.5)
WBC: 11.5 10*3/uL — ABNORMAL HIGH (ref 4.0–10.5)
nRBC: 0 % (ref 0.0–0.2)

## 2021-11-30 MED ORDER — ASPIRIN 81 MG PO CHEW
81.0000 mg | CHEWABLE_TABLET | Freq: Two times a day (BID) | ORAL | 0 refills | Status: AC
Start: 2021-11-30 — End: 2021-12-28

## 2021-11-30 MED ORDER — POLYETHYLENE GLYCOL 3350 17 G PO PACK
17.0000 g | PACK | Freq: Every day | ORAL | 0 refills | Status: DC | PRN
Start: 2021-11-30 — End: 2023-08-27

## 2021-11-30 MED ORDER — DOCUSATE SODIUM 100 MG PO CAPS: 100.0000 mg | ORAL_CAPSULE | Freq: Two times a day (BID) | ORAL | 0 refills | Status: DC

## 2021-11-30 MED ORDER — CELECOXIB 200 MG PO CAPS: 200.0000 mg | ORAL_CAPSULE | Freq: Every day | ORAL | 0 refills | Status: DC

## 2021-11-30 MED ORDER — METHOCARBAMOL 500 MG PO TABS: 500.0000 mg | ORAL_TABLET | Freq: Four times a day (QID) | ORAL | 0 refills | Status: DC | PRN

## 2021-11-30 MED ORDER — HYDROCODONE-ACETAMINOPHEN 7.5-325 MG PO TABS: 1.0000 | ORAL_TABLET | ORAL | 0 refills | Status: DC | PRN

## 2021-11-30 NOTE — Care Plan (Signed)
Ortho Bundle Case Management Note  Patient Details  Name: Whitney Payne MRN: [id number] Date of Birth: 09/14/1945  R TKA on 11-29-21 DCP:  Home with dtr DME:  RW ordered through [employer] PT:  [facility] on 12-03-21                   DME Arranged:  Dan Humphreys rolling DME Agency:  [employer]  HH Arranged:  NA HH Agency:  NA  Additional Comments: Please contact me with any questions of if this plan should need to change.  Ennis Forts, RN,CCM [facility]  [phone] 11/30/2021, 10:27 AM

## 2021-11-30 NOTE — Progress Notes (Signed)
   Subjective: 1 Day Post-Op Procedure(s) (LRB): TOTAL KNEE ARTHROPLASTY (Right) Patient reports pain as mild.   Patient seen in rounds for Dr. Charlann Boxer. Patient is well, and has had no acute complaints or problems. No acute events overnight. Foley catheter removed. Patient ambulated 5 feet with PT.  We will start therapy today.   Objective: Vital signs in last 24 hours: Temp:  [97.5 F (36.4 C)-98.4 F (36.9 C)] 98.4 F (36.9 C) (06/30 0513) Pulse Rate:  [66-87] 74 (06/30 0513) Resp:  [10-20] 18 (06/30 0513) BP: (116-182)/(67-100) 132/80 (06/30 0513) SpO2:  [93 %-100 %] 100 % (06/30 0513)  Intake/Output from previous day:  Intake/Output Summary (Last 24 hours) at 11/30/2021 0807 Last data filed at 11/30/2021 0600 Gross per 24 hour  Intake 2105.29 ml  Output 1505 ml  Net 600.29 ml     Intake/Output this shift: No intake/output data recorded.  Labs: Recent Labs    11/30/21 0329  HGB 9.7*   Recent Labs    11/30/21 0329  WBC 11.5*  RBC 2.91*  HCT 29.5*  PLT 253   Recent Labs    11/30/21 0329  NA 140  K 4.5  CL 110  CO2 24  BUN 15  CREATININE 0.91  GLUCOSE 141*  CALCIUM 8.9   No results for input(s): "LABPT", "INR" in the last 72 hours.  Exam: General - Patient is Alert and Oriented Extremity - Neurologically intact Sensation intact distally Intact pulses distally Dorsiflexion/Plantar flexion intact Dressing - dressing C/D/I Motor Function - intact, moving foot and toes well on exam.   Past Medical History:  Diagnosis Date   Abnormal EKG    nonspecific ST and T-wave changes   Arthritis    Knee problem    Postmenopausal HRT (hormone replacement therapy)    Recurrent vertigo    neuro eval see mri report    Assessment/Plan: 1 Day Post-Op Procedure(s) (LRB): TOTAL KNEE ARTHROPLASTY (Right) Principal Problem:   S/P total knee arthroplasty, right  Estimated body mass index is 24.27 kg/m as calculated from the following:   Height as of this  encounter: 5\' 3"  (1.6 m).   Weight as of this encounter: 62.1 kg. Advance diet Up with therapy D/C IV fluids   Patient's anticipated LOS is less than 2 midnights, meeting these requirements: - Younger than 65 - Lives within 1 hour of care - Has a competent adult at home to recover with post-op recover - NO history of  - Chronic pain requiring opiods  - Diabetes  - Coronary Artery Disease  - Heart failure  - Heart attack  - Stroke  - DVT/VTE  - Cardiac arrhythmia  - Respiratory Failure/COPD  - Renal failure  - Anemia  - Advanced Liver disease     DVT Prophylaxis - Aspirin Weight bearing as tolerated.  Hgb stable at 9.7 this AM.  Plan is to go Home after hospital stay. Plan for discharge today following 1-2 sessions of PT as long as they are meeting their goals. Patient is scheduled for OPPT. Follow up in the office in 2 weeks.   75, PA-C Orthopedic Surgery [phone] 11/30/2021, 8:07 AM

## 2021-11-30 NOTE — TOC Transition Note (Signed)
Transition of Care [facility]) - CM/SW Discharge Note  Patient Details  Name: Whitney Payne MRN: [id number] Date of Birth: Feb 04, 1946  Transition [facility]) CM/SW Contact:  Sherie Don, LCSW Phone Number: 11/30/2021, 9:59 AM  Clinical Narrative: Patient is expected to discharge home after working with PT. CSW met with patient to confirm discharge plan and needs. Patient will go home with OPPT at [facility]. Patient will need a rolling walker, which was delivered to the patient's room by [employer]. TOC signing off.  Final next level of care: OP Rehab Barriers to Discharge: No Barriers Identified  Patient Goals and CMS Choice Patient states their goals for this hospitalization and ongoing recovery are:: Discharge home with OPPT at [facility] CMS Medicare.gov Compare Post Acute Care list provided to:: Patient Choice offered to / list presented to : Patient  Discharge Plan and Services       DME Arranged: Walker rolling DME Agency: [employer] Representative spoke with at DME Agency: Prearranged in orthopedist's office  Readmission Risk Interventions     No data to display

## 2021-11-30 NOTE — Progress Notes (Signed)
Physical Therapy Treatment Patient Details Name: Whitney Payne MRN: [id number] DOB: [birth year] Today's Date: 11/30/2021   History of Present Illness 76 yo female S/P Right TKA 11/29/2021. HCW:[id number],    PT Comments    The patient is progressing well. Patient has met PT goals for safe ambulation.   Recommendations for follow up therapy are one component of a multi-disciplinary discharge planning process, led by the attending physician.  Recommendations may be updated based on patient status, additional functional criteria and insurance authorization.  Follow Up Recommendations  Follow physician's recommendations for discharge plan and follow up therapies     Assistance Recommended at Discharge Intermittent Supervision/Assistance  Patient can return home with the following A little help with walking and/or transfers;Assistance with cooking/housework;Assist for transportation;Help with stairs or ramp for entrance;A little help with bathing/dressing/bathroom   Equipment Recommendations  Rolling walker (2 wheels)    Recommendations for Other Services       Precautions / Restrictions Precautions Precautions: Fall;Knee     Mobility  Bed Mobility   Bed Mobility: Supine to Sit     Supine to sit: Supervision     General bed mobility comments: no assistance required    Transfers Overall transfer level: Needs assistance Equipment used: Rolling walker (2 wheels) Transfers: Sit to/from Stand Sit to Stand: Min guard           General transfer comment: cues for hand position    Ambulation/Gait Ambulation/Gait assistance: Min guard Gait Distance (Feet): 100 Feet Assistive device: Rolling walker (2 wheels) Gait Pattern/deviations: Step-to pattern, Step-through pattern           Stairs             Wheelchair Mobility    Modified Rankin (Stroke Patients Only)       Balance Overall balance assessment: Mild deficits observed, not formally tested                                           Cognition Arousal/Alertness: Awake/alert                                              Exercises Total Joint Exercises Ankle Circles/Pumps: AROM, Both, 10 reps Quad Sets: AROM, Both, 10 reps Short Arc Quad: AROM, Right, 5 reps Heel Slides: AAROM, Right, 5 reps Hip ABduction/ADduction: AROM, Right, 5 reps Straight Leg Raises: AROM, Right, 5 reps, AAROM Long Arc Quad: AROM, Right, 5 reps Goniometric ROM: 5-80 right knee flex    General Comments        Pertinent Vitals/Pain Pain Assessment Pain Score: 6  Pain Location: right knee Pain Descriptors / Indicators: Aching, Discomfort, Grimacing Pain Intervention(s): Monitored during session, Premedicated before session, Ice applied    Home Living                          Prior Function            PT Goals (current goals can now be found in the care plan section) Progress towards PT goals: Progressing toward goals    Frequency    7X/week      PT Plan Current plan remains appropriate    Co-evaluation  AM-PAC PT "6 Clicks" Mobility   Outcome Measure  Help needed turning from your back to your side while in a flat bed without using bedrails?: A Little Help needed moving from lying on your back to sitting on the side of a flat bed without using bedrails?: A Little Help needed moving to and from a bed to a chair (including a wheelchair)?: A Little Help needed standing up from a chair using your arms (e.g., wheelchair or bedside chair)?: A Little Help needed to walk in hospital room?: A Little Help needed climbing 3-5 steps with a railing? : A Little 6 Click Score: 18    End of Session Equipment Utilized During Treatment: Gait belt Activity Tolerance: Patient tolerated treatment well Patient left: in chair;with call bell/phone within reach;with chair alarm set Nurse Communication: Mobility status PT Visit Diagnosis:  Unsteadiness on feet (R26.81);Difficulty in walking, not elsewhere classified (R26.2);Pain Pain - Right/Left: Right Pain - part of body: Knee     Time: 3790-2409 PT Time Calculation (min) (ACUTE ONLY): 24 min  Charges:  $Gait Training: 8-22 mins $Therapeutic Exercise: 8-22 mins                     [place] Office [phone] Weekend [phone]    Claretha Cooper 11/30/2021, 1:38 PM

## 2021-11-30 NOTE — Plan of Care (Signed)
  Problem: Nutrition: Goal: Adequate nutrition will be maintained Outcome: Progressing   Problem: Pain Managment: Goal: General experience of comfort will improve Outcome: Progressing   

## 2021-12-03 DIAGNOSIS — M25561 Pain in right knee: Secondary | ICD-10-CM | POA: Diagnosis not present

## 2021-12-05 DIAGNOSIS — M25561 Pain in right knee: Secondary | ICD-10-CM | POA: Diagnosis not present

## 2021-12-05 NOTE — Discharge Summary (Signed)
Patient ID: Whitney Payne MRN: [id number] DOB/AGE: 1945/12/31 76 y.o.  Admit date: 11/29/2021 Discharge date: 11/30/2021  Admission Diagnoses:  Right knee osteoarthritis  Discharge Diagnoses:  Principal Problem:   S/P total knee arthroplasty, right   Past Medical History:  Diagnosis Date   Abnormal EKG    nonspecific ST and T-wave changes   Arthritis    Knee problem    Postmenopausal HRT (hormone replacement therapy)    Recurrent vertigo    neuro eval see mri report    Surgeries: Procedure(s): TOTAL KNEE ARTHROPLASTY on 11/29/2021   Consultants:   Discharged Condition: Improved  Hospital Course: Whitney Payne is an 76 y.o. female who was admitted 11/29/2021 for operative treatment ofS/P total knee arthroplasty, right. Patient has severe unremitting pain that affects sleep, daily activities, and work/hobbies. After pre-op clearance the patient was taken to the operating room on 11/29/2021 and underwent  Procedure(s): TOTAL KNEE ARTHROPLASTY.    Patient was given perioperative antibiotics:  Anti-infectives (From admission, onward)    Start     Dose/Rate Route Frequency Ordered Stop   11/29/21 1330  ceFAZolin (ANCEF) IVPB 2g/100 mL premix        2 g 200 mL/hr over 30 Minutes Intravenous Every 6 hours 11/29/21 1109 11/30/21 1107   11/29/21 0600  ceFAZolin (ANCEF) IVPB 2g/100 mL premix        2 g 200 mL/hr over 30 Minutes Intravenous On call to O.R. 11/29/21 8003 11/29/21 0737        Patient was given sequential compression devices, early ambulation, and chemoprophylaxis to prevent DVT. Patient worked with PT and was meeting their goals regarding safe ambulation and transfers.  Patient benefited maximally from hospital stay and there were no complications.    Recent vital signs: No data found.   Recent laboratory studies: No results for input(s): "WBC", "HGB", "HCT", "PLT", "NA", "K", "CL", "CO2", "BUN", "CREATININE", "GLUCOSE", "INR", "CALCIUM" in the last 72  hours.  Invalid input(s): "PT", "2"   Discharge Medications:   Allergies as of 11/30/2021       Reactions   Penicillins Itching, Swelling   Tolerated Cephalosporin Date: 11/30/21.   Tizanidine Itching, Rash, Other (See Comments)   Feels sick        Medication List     STOP taking these medications    acetaminophen 500 MG tablet Commonly known as: TYLENOL       TAKE these medications    albuterol 108 (90 Base) MCG/ACT inhaler Commonly known as: VENTOLIN HFA Inhale 1-2 puffs into the lungs every 6 (six) hours as needed for wheezing or shortness of breath.   aspirin 81 MG chewable tablet Chew 1 tablet (81 mg total) by mouth 2 (two) times daily for 28 days.   CALCIUM + D PO Take 1 tablet by mouth daily.   celecoxib 200 MG capsule Commonly known as: CELEBREX Take 1 capsule (200 mg total) by mouth daily.   docusate sodium 100 MG capsule Commonly known as: COLACE Take 1 capsule (100 mg total) by mouth 2 (two) times daily.   HYDROcodone-acetaminophen 7.5-325 MG tablet Commonly known as: NORCO Take 1 tablet by mouth every 4 (four) hours as needed for severe pain.   Melatonin 10 MG Caps Take 10 mg by mouth at bedtime as needed (sleep).   methocarbamol 500 MG tablet Commonly known as: ROBAXIN Take 1 tablet (500 mg total) by mouth every 6 (six) hours as needed for muscle spasms.   polyethylene glycol 17 g packet  Commonly known as: MIRALAX / GLYCOLAX Take 17 g by mouth daily as needed for mild constipation.   THERAWORX RELIEF EX Apply 1 Application topically daily as needed (pain).   vitamin B-12 500 MCG tablet Commonly known as: CYANOCOBALAMIN Take 500 mcg by mouth daily.               Discharge Care Instructions  (From admission, onward)           Start     Ordered   11/30/21 0000  Change dressing       Comments: Maintain surgical dressing until follow up in the clinic. If the edges start to pull up, may reinforce with tape. If the dressing is  no longer working, may remove and cover with gauze and tape, but must keep the area dry and clean.  Call with any questions or concerns.   11/30/21 7939            Diagnostic Studies: No results found.  Disposition: Discharge disposition: 01-Home or Self Care       Discharge Instructions     Call MD / Call 911   Complete by: As directed    If you experience chest pain or shortness of breath, CALL 911 and be transported to the hospital emergency room.  If you develope a fever above 101 F, pus (white drainage) or increased drainage or redness at the wound, or calf pain, call your surgeon's office.   Change dressing   Complete by: As directed    Maintain surgical dressing until follow up in the clinic. If the edges start to pull up, may reinforce with tape. If the dressing is no longer working, may remove and cover with gauze and tape, but must keep the area dry and clean.  Call with any questions or concerns.   Constipation Prevention   Complete by: As directed    Drink plenty of fluids.  Prune juice may be helpful.  You may use a stool softener, such as Colace (over the counter) 100 mg twice a day.  Use MiraLax (over the counter) for constipation as needed.   Diet - low sodium heart healthy   Complete by: As directed    Increase activity slowly as tolerated   Complete by: As directed    Weight bearing as tolerated with assist device (walker, cane, etc) as directed, use it as long as suggested by your surgeon or therapist, typically at least 4-6 weeks.   Post-operative opioid taper instructions:   Complete by: As directed    POST-OPERATIVE OPIOID TAPER INSTRUCTIONS: It is important to wean off of your opioid medication as soon as possible. If you do not need pain medication after your surgery it is ok to stop day one. Opioids include: Codeine, Hydrocodone(Norco, Vicodin), Oxycodone(Percocet, oxycontin) and hydromorphone amongst others.  Long term and even short term use of  opiods can cause: Increased pain response Dependence Constipation Depression Respiratory depression And more.  Withdrawal symptoms can include Flu like symptoms Nausea, vomiting And more Techniques to manage these symptoms Hydrate well Eat regular healthy meals Stay active Use relaxation techniques(deep breathing, meditating, yoga) Do Not substitute Alcohol to help with tapering If you have been on opioids for less than two weeks and do not have pain than it is ok to stop all together.  Plan to wean off of opioids This plan should start within one week post op of your joint replacement. Maintain the same interval or time between taking each dose and  first decrease the dose.  Cut the total daily intake of opioids by one tablet each day Next start to increase the time between doses. The last dose that should be eliminated is the evening dose.      TED hose   Complete by: As directed    Use stockings (TED hose) for 2 weeks on both leg(s).  You may remove them at night for sleeping.        Follow-up Information     Durene Romans, MD. Go on 12/14/2021.   Specialty: Orthopedic Surgery Why: You are scheduled for a follow up appointment on 12-14-21 at 10:30 am. Contact information: [address] [place] [address] [phone]                  Signed: Cassandria Anger 12/05/2021, 9:28 AM

## 2021-12-07 DIAGNOSIS — M25561 Pain in right knee: Secondary | ICD-10-CM | POA: Diagnosis not present

## 2021-12-10 DIAGNOSIS — M25561 Pain in right knee: Secondary | ICD-10-CM | POA: Diagnosis not present

## 2021-12-12 DIAGNOSIS — M25561 Pain in right knee: Secondary | ICD-10-CM | POA: Diagnosis not present

## 2021-12-19 DIAGNOSIS — M25561 Pain in right knee: Secondary | ICD-10-CM | POA: Diagnosis not present

## 2021-12-24 DIAGNOSIS — M25561 Pain in right knee: Secondary | ICD-10-CM | POA: Diagnosis not present

## 2022-01-16 DIAGNOSIS — M1711 Unilateral primary osteoarthritis, right knee: Secondary | ICD-10-CM | POA: Diagnosis not present

## 2022-01-16 DIAGNOSIS — Z5189 Encounter for other specified aftercare: Secondary | ICD-10-CM | POA: Diagnosis not present

## 2022-07-15 ENCOUNTER — Ambulatory Visit (INDEPENDENT_AMBULATORY_CARE_PROVIDER_SITE_OTHER): Payer: Medicare Other

## 2022-07-15 ENCOUNTER — Ambulatory Visit: Payer: Medicare Other

## 2022-07-15 VITALS — Ht 63.0 in | Wt 138.0 lb

## 2022-07-15 DIAGNOSIS — Z0000 Encounter for general adult medical examination without abnormal findings: Secondary | ICD-10-CM

## 2022-07-15 NOTE — Patient Instructions (Addendum)
Whitney Payne , Thank you for taking time to come for your Medicare Wellness Visit. I appreciate your ongoing commitment to your health goals. Please review the following plan we discussed and let me know if I can assist you in the future.   These are the goals we discussed:  Goals       Patient Stated (pt-stated)      Stay Healthy.        This is a list of the screening recommended for you and due dates:  Health Maintenance  Topic Date Due   DTaP/Tdap/Td vaccine (1 - Tdap) Never done   COVID-19 Vaccine (5 - 2023-24 season) 07/31/2022*   Flu Shot  09/01/2022*   Pneumonia Vaccine (1 of 1 - PCV) 07/16/2023*   Medicare Annual Wellness Visit  07/16/2023   DEXA scan (bone density measurement)  Completed   Hepatitis C Screening: USPSTF Recommendation to screen - Ages 61-79 yo.  Completed   Zoster (Shingles) Vaccine  Completed   HPV Vaccine  Aged Out  *Topic was postponed. The date shown is not the original due date.   Opioid Pain Medicine Management Opioids are powerful medicines that are used to treat moderate to severe pain. When used for short periods of time, they can help you to: Sleep better. Do better in physical or occupational therapy. Feel better in the first few days after an injury. Recover from surgery. Opioids should be taken with the supervision of a trained health care provider. They should be taken for the shortest period of time possible. This is because opioids can be addictive, and the longer you take opioids, the greater your risk of addiction. This addiction can also be called opioid use disorder. What are the risks? Using opioid pain medicines for longer than 3 days increases your risk of side effects. Side effects include: Constipation. Nausea and vomiting. Breathing difficulties (respiratory depression). Drowsiness. Confusion. Opioid use disorder. Itching. Taking opioid pain medicine for a long period of time can affect your ability to do daily tasks. It also  puts you at risk for: Motor vehicle crashes. Depression. Suicide. Heart attack. Overdose, which can be life-threatening. What is a pain treatment plan? A pain treatment plan is an agreement between you and your health care provider. Pain is unique to each person, and treatments vary depending on your condition. To manage your pain, you and your health care provider need to work together. To help you do this: Discuss the goals of your treatment, including how much pain you might expect to have and how you will manage the pain. Review the risks and benefits of taking opioid medicines. Remember that a good treatment plan uses more than one approach and minimizes the chance of side effects. Be honest about the amount of medicines you take and about any drug or alcohol use. Get pain medicine prescriptions from only one health care provider. Pain can be managed with many types of alternative treatments. Ask your health care provider to refer you to one or more specialists who can help you manage pain through: Physical or occupational therapy. Counseling (cognitive behavioral therapy). Good nutrition. Biofeedback. Massage. Meditation. Non-opioid medicine. Following a gentle exercise program. How to use opioid pain medicine Taking medicine Take your pain medicine exactly as told by your health care provider. Take it only when you need it. If your pain gets less severe, you may take less than your prescribed dose if your health care provider approves. If you are not having pain, do nottake  pain medicine unless your health care provider tells you to take it. If your pain is severe, do nottry to treat it yourself by taking more pills than instructed on your prescription. Contact your health care provider for help. Write down the times when you take your pain medicine. It is easy to become confused while on pain medicine. Writing the time can help you avoid overdose. Take other over-the-counter or  prescription medicines only as told by your health care provider. Keeping yourself and others safe  While you are taking opioid pain medicine: Do not drive, use machinery, or power tools. Do not sign legal documents. Do not drink alcohol. Do not take sleeping pills. Do not supervise children by yourself. Do not do activities that require climbing or being in high places. Do not go to a lake, river, ocean, spa, or swimming pool. Do not share your pain medicine with anyone. Keep pain medicine in a locked cabinet or in a secure area where pets and children cannot reach it. Stopping your use of opioids If you have been taking opioid medicine for more than a few weeks, you may need to slowly decrease (taper) how much you take until you stop completely. Tapering your use of opioids can decrease your risk of symptoms of withdrawal, such as: Pain and cramping in the abdomen. Nausea. Sweating. Sleepiness. Restlessness. Uncontrollable shaking (tremors). Cravings for the medicine. Do not attempt to taper your use of opioids on your own. Talk with your health care provider about how to do this. Your health care provider may prescribe a step-down schedule based on how much medicine you are taking and how long you have been taking it. Getting rid of leftover pills Do not save any leftover pills. Get rid of leftover pills safely by: Taking the medicine to a prescription take-back program. This is usually offered by the county or law enforcement. Bringing them to a pharmacy that has a drug disposal container. Flushing them down the toilet. Check the label or package insert of your medicine to see whether this is safe to do. Throwing them out in the trash. Check the label or package insert of your medicine to see whether this is safe to do. If it is safe to throw it out, remove the medicine from the original container, put it into a sealable bag or container, and mix it with used coffee grounds, food  scraps, dirt, or cat litter before putting it in the trash. Follow these instructions at home: Activity Do exercises as told by your health care provider. Avoid activities that make your pain worse. Return to your normal activities as told by your health care provider. Ask your health care provider what activities are safe for you. General instructions You may need to take these actions to prevent or treat constipation: Drink enough fluid to keep your urine pale yellow. Take over-the-counter or prescription medicines. Eat foods that are high in fiber, such as beans, whole grains, and fresh fruits and vegetables. Limit foods that are high in fat and processed sugars, such as fried or sweet foods. Keep all follow-up visits. This is important. Where to find support If you have been taking opioids for a long time, you may benefit from receiving support for quitting from a local support group or counselor. Ask your health care provider for a referral to these resources in your area. Where to find more information Centers for Disease Control and Prevention (CDC): http://www.wolf.info/ [place] Food and Drug Administration (FDA): [employer] Get help  right away if: You may have taken too much of an opioid (overdosed). Common symptoms of an overdose: Your breathing is slower or more shallow than normal. You have a very slow heartbeat (pulse). You have slurred speech. You have nausea and vomiting. Your pupils become very small. You have other potential symptoms: You are very confused. You faint or feel like you will faint. You have cold, clammy skin. You have blue lips or fingernails. You have thoughts of harming yourself or harming others. These symptoms may represent a serious problem that is an emergency. Do not wait to see if the symptoms will go away. Get medical help right away. Call your local emergency services (911 in the [place] Do not drive yourself to the hospital.  If you ever feel like you may  hurt yourself or others, or have thoughts about taking your own life, get help right away. Go to your nearest emergency department or: Call your local emergency services (911 in the [place] Call the [facility] 905-247-6352 in the [place] Call a suicide crisis helpline, such as the [place] at 680 380 5594 or 988 in the [place]. This is open 24 hours a day in the [place] Text the Crisis Text Line at (408) 870-8602 (in the [place].). Summary Opioid medicines can help you manage moderate to severe pain for a short period of time. A pain treatment plan is an agreement between you and your health care provider. Discuss the goals of your treatment, including how much pain you might expect to have and how you will manage the pain. If you think that you or someone else may have taken too much of an opioid, get medical help right away. This information is not intended to replace advice given to you by your health care provider. Make sure you discuss any questions you have with your health care provider. Document Revised: 12/13/2020 Document Reviewed: 08/30/2020 Elsevier Patient Education  [place] directives: Please bring a copy of your health care power of attorney and living will to the office to be added to your chart at your convenience.   Conditions/risks identified: None  Next appointment: Follow up in one year for your annual wellness visit     Preventive Care 65 Years and Older, Female Preventive care refers to lifestyle choices and visits with your health care provider that can promote health and wellness. What does preventive care include? A yearly physical exam. This is also called an annual well check. Dental exams once or twice a year. Routine eye exams. Ask your health care provider how often you should have your eyes checked. Personal lifestyle choices, including: Daily care of your teeth and gums. Regular physical  activity. Eating a healthy diet. Avoiding tobacco and drug use. Limiting alcohol use. Practicing safe sex. Taking low-dose aspirin every day. Taking vitamin and mineral supplements as recommended by your health care provider. What happens during an annual well check? The services and screenings done by your health care provider during your annual well check will depend on your age, overall health, lifestyle risk factors, and family history of disease. Counseling  Your health care provider may ask you questions about your: Alcohol use. Tobacco use. Drug use. Emotional well-being. Home and relationship well-being. Sexual activity. Eating habits. History of falls. Memory and ability to understand (cognition). Work and work Statistician. Reproductive health. Screening  You may have the following tests or measurements: Height, weight, and BMI. Blood pressure. Lipid and cholesterol levels. These may  be checked every 5 years, or more frequently if you are over 47 years old. Skin check. Lung cancer screening. You may have this screening every year starting at age 57 if you have a 30-pack-year history of smoking and currently smoke or have quit within the past 15 years. Fecal occult blood test (FOBT) of the stool. You may have this test every year starting at age 14. Flexible sigmoidoscopy or colonoscopy. You may have a sigmoidoscopy every 5 years or a colonoscopy every 10 years starting at age 56. Hepatitis C blood test. Hepatitis B blood test. Sexually transmitted disease (STD) testing. Diabetes screening. This is done by checking your blood sugar (glucose) after you have not eaten for a while (fasting). You may have this done every 1-3 years. Bone density scan. This is done to screen for osteoporosis. You may have this done starting at age 53. Mammogram. This may be done every 1-2 years. Talk to your health care provider about how often you should have regular mammograms. Talk with your  health care provider about your test results, treatment options, and if necessary, the need for more tests. Vaccines  Your health care provider may recommend certain vaccines, such as: Influenza vaccine. This is recommended every year. Tetanus, diphtheria, and acellular pertussis (Tdap, Td) vaccine. You may need a Td booster every 10 years. Zoster vaccine. You may need this after age 53. Pneumococcal 13-valent conjugate (PCV13) vaccine. One dose is recommended after age 43. Pneumococcal polysaccharide (PPSV23) vaccine. One dose is recommended after age 63. Talk to your health care provider about which screenings and vaccines you need and how often you need them. This information is not intended to replace advice given to you by your health care provider. Make sure you discuss any questions you have with your health care provider. Document Released: 06/16/2015 Document Revised: 02/07/2016 Document Reviewed: 03/21/2015 Elsevier Interactive Patient Education  2017 [employer] Prevention in the Home Falls can cause injuries. They can happen to people of all ages. There are many things you can do to make your home safe and to help prevent falls. What can I do on the outside of my home? Regularly fix the edges of walkways and driveways and fix any cracks. Remove anything that might make you trip as you walk through a door, such as a raised step or threshold. Trim any bushes or trees on the path to your home. Use bright outdoor lighting. Clear any walking paths of anything that might make someone trip, such as rocks or tools. Regularly check to see if handrails are loose or broken. Make sure that both sides of any steps have handrails. Any raised decks and porches should have guardrails on the edges. Have any leaves, snow, or ice cleared regularly. Use sand or salt on walking paths during winter. Clean up any spills in your garage right away. This includes oil or grease spills. What can I  do in the bathroom? Use night lights. Install grab bars by the toilet and in the tub and shower. Do not use towel bars as grab bars. Use non-skid mats or decals in the tub or shower. If you need to sit down in the shower, use a plastic, non-slip stool. Keep the floor dry. Clean up any water that spills on the floor as soon as it happens. Remove soap buildup in the tub or shower regularly. Attach bath mats securely with double-sided non-slip rug tape. Do not have throw rugs and other things on the floor  that can make you trip. What can I do in the bedroom? Use night lights. Make sure that you have a light by your bed that is easy to reach. Do not use any sheets or blankets that are too big for your bed. They should not hang down onto the floor. Have a firm chair that has side arms. You can use this for support while you get dressed. Do not have throw rugs and other things on the floor that can make you trip. What can I do in the kitchen? Clean up any spills right away. Avoid walking on wet floors. Keep items that you use a lot in easy-to-reach places. If you need to reach something above you, use a strong step stool that has a grab bar. Keep electrical cords out of the way. Do not use floor polish or wax that makes floors slippery. If you must use wax, use non-skid floor wax. Do not have throw rugs and other things on the floor that can make you trip. What can I do with my stairs? Do not leave any items on the stairs. Make sure that there are handrails on both sides of the stairs and use them. Fix handrails that are broken or loose. Make sure that handrails are as long as the stairways. Check any carpeting to make sure that it is firmly attached to the stairs. Fix any carpet that is loose or worn. Avoid having throw rugs at the top or bottom of the stairs. If you do have throw rugs, attach them to the floor with carpet tape. Make sure that you have a light switch at the top of the stairs  and the bottom of the stairs. If you do not have them, ask someone to add them for you. What else can I do to help prevent falls? Wear shoes that: Do not have high heels. Have rubber bottoms. Are comfortable and fit you well. Are closed at the toe. Do not wear sandals. If you use a stepladder: Make sure that it is fully opened. Do not climb a closed stepladder. Make sure that both sides of the stepladder are locked into place. Ask someone to hold it for you, if possible. Clearly mark and make sure that you can see: Any grab bars or handrails. First and last steps. Where the edge of each step is. Use tools that help you move around (mobility aids) if they are needed. These include: Canes. Walkers. Scooters. Crutches. Turn on the lights when you go into a dark area. Replace any light bulbs as soon as they burn out. Set up your furniture so you have a clear path. Avoid moving your furniture around. If any of your floors are uneven, fix them. If there are any pets around you, be aware of where they are. Review your medicines with your doctor. Some medicines can make you feel dizzy. This can increase your chance of falling. Ask your doctor what other things that you can do to help prevent falls. This information is not intended to replace advice given to you by your health care provider. Make sure you discuss any questions you have with your health care provider. Document Released: 03/16/2009 Document Revised: 10/26/2015 Document Reviewed: 06/24/2014 Elsevier Interactive Patient Education  2017 [employer].

## 2022-07-15 NOTE — Progress Notes (Signed)
Medicare  Subjective:   Whitney Payne is a 77 y.o. female who presents for Medicare Annual (Subsequent) preventive examination.  Review of Systems    Virtual Visit via Telephone Note  I connected with  [name] on 07/15/22 at 10:30 AM EST by telephone and verified that I am speaking with the correct person using two identifiers.  Location: Patient: Home Provider: Office Persons participating in the virtual visit: patient/Nurse Health Advisor   I discussed the limitations, risks, security and privacy concerns of performing an evaluation and management service by telephone and the availability of in person appointments. The patient expressed understanding and agreed to proceed.  Interactive audio and video telecommunications were attempted between this nurse and patient, however failed, due to patient having technical difficulties OR patient did not have access to video capability.  We continued and completed visit with audio only.  Some vital signs may be absent or patient reported.   [name], LPN  Cardiac Risk Factors include: advanced age (>35mn, >>81women)     Objective:    Today's Vitals   07/15/22 1020  Weight: 138 lb (62.6 kg)  Height: 5' 3"$  (1.6 m)   Body mass index is 24.45 kg/m.     07/15/2022   10:30 AM 11/29/2021    3:14 PM 11/23/2021   10:59 AM 07/09/2021   10:34 AM  Advanced Directives  Does Patient Have a Medical Advance Directive? Yes Yes Yes Yes  Type of AParamedicof [place]Living will [place]Living will HBear Valley [place]Living will [place]Living will  Does patient want to make changes to medical advance directive?  No - Patient declined    Copy of [place] Chart? No - copy requested No - copy requested  No - copy requested    Current Medications (verified) Outpatient Encounter Medications as of 07/15/2022  Medication Sig    albuterol (VENTOLIN HFA) 108 (90 Base) MCG/ACT inhaler Inhale 1-2 puffs into the lungs every 6 (six) hours as needed for wheezing or shortness of breath.   Calcium Citrate-Vitamin D (CALCIUM + D PO) Take 1 tablet by mouth daily.   celecoxib (CELEBREX) 200 MG capsule Take 1 capsule (200 mg total) by mouth daily.   docusate sodium (COLACE) 100 MG capsule Take 1 capsule (100 mg total) by mouth 2 (two) times daily.   Homeopathic Products (THERAWORX RELIEF EX) Apply 1 Application topically daily as needed (pain).   HYDROcodone-acetaminophen (NORCO) 7.5-325 MG tablet Take 1 tablet by mouth every 4 (four) hours as needed for severe pain.   Melatonin 10 MG CAPS Take 10 mg by mouth at bedtime as needed (sleep).   methocarbamol (ROBAXIN) 500 MG tablet Take 1 tablet (500 mg total) by mouth every 6 (six) hours as needed for muscle spasms.   polyethylene glycol (MIRALAX / GLYCOLAX) 17 g packet Take 17 g by mouth daily as needed for mild constipation.   vitamin B-12 (CYANOCOBALAMIN) 500 MCG tablet Take 500 mcg by mouth daily.   No facility-administered encounter medications on file as of 07/15/2022.    Allergies (verified) Penicillins and Tizanidine   History: Past Medical History:  Diagnosis Date   Abnormal EKG    nonspecific ST and T-wave changes   Arthritis    Knee problem    Postmenopausal HRT (hormone replacement therapy)    Recurrent vertigo    neuro eval see mri report   Past Surgical History:  Procedure Laterality Date  ABDOMINAL HYSTERECTOMY     CATARACT EXTRACTION     left knee arthroscopy      TOTAL KNEE ARTHROPLASTY Right 11/29/2021   Procedure: TOTAL KNEE ARTHROPLASTY;  Surgeon: Paralee Cancel, MD;  Location: [facility];  Service: Orthopedics;  Laterality: Right;   TUBAL LIGATION     Family History  Problem Relation Age of Onset   Other Mother        died of se of patch narcotic, had artery diesease and amputation   Other Father        collapsed lung- smoker   Social History    Socioeconomic History   Marital status: Widowed    Spouse name: Not on file   Number of children: Not on file   Years of education: Not on file   Highest education level: Not on file  Occupational History   Not on file  Tobacco Use   Smoking status: Former    Types: Cigarettes    Quit date: 10/14/1963    Years since quitting: 58.7   Smokeless tobacco: Never  Vaping Use   Vaping Use: Never used  Substance and Sexual Activity   Alcohol use: Yes    Comment: occasionally   Drug use: No   Sexual activity: Not on file  Other Topics Concern   Not on file  Social History Narrative   Separated   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 work   Exercising ok   Neg tad    Social Determinants of Health   Financial Resource Strain: [place]  (07/15/2022)   Overall Financial Resource Strain (CARDIA)    Difficulty of Paying Living Expenses: Not hard at all  Food Insecurity: No [place] (07/15/2022)   Hunger Vital Sign    Worried About Running Out of Food in the Last Year: Never true    [place] in the Last Year: Never true  Transportation Needs: No Transportation Needs (07/15/2022)   PRAPARE - Hydrologist (Medical): No    Lack of Transportation (Non-Medical): No  Physical Activity: Insufficiently Active (07/15/2022)   Exercise Vital Sign    Days of Exercise per Week: 3 days    Minutes of Exercise per Session: 30 min  Stress: No Stress Concern Present (07/15/2022)   [place]    Feeling of Stress : Not at all  Social Connections: Moderately Integrated (07/15/2022)   Social Connection and Isolation Panel [NHANES]    Frequency of Communication with Friends and Family: More than three times a week    Frequency of Social Gatherings with Friends and Family: More than three times a week    Attends Religious Services: More than 4 times per year    Active Member of [employer] or  Organizations: Yes    Attends Archivist Meetings: More than 4 times per year    Marital Status: Widowed    Tobacco Counseling Counseling given: Not Answered   Clinical Intake:  Pre-visit preparation completed: No  Pain : No/denies pain     BMI - recorded: 24.45 Nutritional Status: BMI of 19-24  Normal Nutritional Risks: None Diabetes: No  How often do you need to have someone help you when you read instructions, pamphlets, or other written materials from your doctor or pharmacy?: 1 - Never  Diabetic?  No  Interpreter Needed?: No  Information entered by :: Rolene Arbour LPN   Activities of Daily Living  07/15/2022   10:28 AM 11/29/2021    2:30 PM  In your present state of health, do you have any difficulty performing the following activities:  Hearing? 0   Vision? 0   Difficulty concentrating or making decisions? 0   Walking or climbing stairs? 0   Dressing or bathing? 0   Doing errands, shopping? 0 0  Preparing Food and eating ? N   Using the Toilet? N   In the past six months, have you accidently leaked urine? N   Do you have problems with loss of bowel control? N   Managing your Medications? N   Managing your Finances? N   Housekeeping or managing your Housekeeping? N     Patient Care Team: Panosh, Standley Brooking, MD as PCP - General Paralee Cancel, MD as Consulting Physician (Orthopedic Surgery)  Indicate any recent Medical Services you may have received from other than [facility] providers in the past year (date may be approximate).     Assessment:   This is a routine wellness examination for [place].  Hearing/Vision screen Hearing Screening - Comments:: Denies hearing difficulties   Vision Screening - Comments:: Wears rx glasses - up to date with routine eye exams with  [facility]  Dietary issues and exercise activities discussed: Exercise limited by: None identified   Goals Addressed               This Visit's Progress     Patient  Stated (pt-stated)        Stay Healthy.       Depression Screen    07/15/2022   10:28 AM 10/10/2021   10:18 AM 07/09/2021   10:35 AM 09/06/2019    2:30 PM 07/10/2018    9:31 AM 12/03/2017    1:47 PM 11/13/2016    9:48 AM  PHQ 2/9 Scores  PHQ - 2 Score 0 0 0 0 0 0 0  PHQ- 9 Score  0  0       Fall Risk    07/15/2022   10:29 AM 10/10/2021   10:18 AM 07/09/2021   10:35 AM 02/14/2021    3:36 PM 09/06/2019    2:30 PM  [place] in the past year? 1 0 0 0 0  Number falls in past yr: 0 0  0   Injury with Fall? 1 0  0   Comment Skin tear to forehead, No medical attention needed      Risk for fall due to : No Fall Risks No Fall Risks No Fall Risks    Follow up Falls prevention discussed Falls evaluation completed Falls evaluation completed;Education provided;Falls prevention discussed      FALL RISK PREVENTION PERTAINING TO THE HOME:  Any stairs in or around the home? Yes  If so, are there any without handrails? No  Home free of loose throw rugs in walkways, pet beds, electrical cords, etc? Yes  Adequate lighting in your home to reduce risk of falls? Yes   ASSISTIVE DEVICES UTILIZED TO PREVENT FALLS:  Life alert? Yes Use of a cane, walker or w/c? Yes  Grab bars in the bathroom? Yes  Shower chair or bench in shower? Yes  Elevated toilet seat or a handicapped toilet? Yes   TIMED UP AND GO:  Was the test performed? No .    Cognitive Function:        07/15/2022   10:31 AM 07/09/2021   10:37 AM  6CIT Screen  What Year? 0  points 0 points  What month? 0 points 0 points  What time? 0 points 0 points  Count back from 20 2 points 0 points  Months in reverse 4 points 4 points  Repeat phrase 0 points 0 points  Total Score 6 points 4 points    Immunizations Immunization History  Administered Date(s) Administered   Fluad Quad(high Dose 65+) 05/12/2021   PFIZER(Purple Top)SARS-COV-2 Vaccination 07/25/2019, 08/18/2019, 04/12/2020, 11/29/2020   Zoster Recombinat (Shingrix)  12/31/2018, 01/12/2020    TDAP status: Due, Education has been provided regarding the importance of this vaccine. Advised may receive this vaccine at local pharmacy or Health Dept. Aware to provide a copy of the vaccination record if obtained from local pharmacy or Health Dept. Verbalized acceptance and understanding.  Flu Vaccine status: Up to date  Pneumococcal vaccine status: Due, Education has been provided regarding the importance of this vaccine. Advised may receive this vaccine at local pharmacy or Health Dept. Aware to provide a copy of the vaccination record if obtained from local pharmacy or Health Dept. Verbalized acceptance and understanding.  Covid-19 vaccine status: Completed vaccines  Qualifies for Shingles Vaccine? Yes   Zostavax completed Yes   Shingrix Completed?: Yes  Screening Tests Health Maintenance  Topic Date Due   DTaP/Tdap/Td (1 - Tdap) Never done   COVID-19 Vaccine (5 - 2023-24 season) 07/31/2022 (Originally 02/01/2022)   INFLUENZA VACCINE  09/01/2022 (Originally 01/01/2022)   Pneumonia Vaccine 86+ Years old (1 of 1 - PCV) 07/16/2023 (Originally 06/19/2009)   Medicare Annual Wellness (AWV)  07/16/2023   DEXA SCAN  Completed   Hepatitis C Screening  Completed   Zoster Vaccines- Shingrix  Completed   HPV VACCINES  Aged Out    Health Maintenance  Health Maintenance Due  Topic Date Due   DTaP/Tdap/Td (1 - Tdap) Never done    Colorectal cancer screening: No longer required.   Mammogram status: No longer required due to  .  Bone Density status: Completed 09/10/19. Results reflect: Bone density results: OSTEOPOROSIS. Repeat every   years.  Lung Cancer Screening: (Low Dose CT Chest recommended if Age 27-80 years, 30 pack-year currently smoking OR have quit w/in 15years.) does not qualify.     Additional Screening:  Hepatitis C Screening: does qualify; Completed 12/03/17  Vision Screening: Recommended annual ophthalmology exams for early detection of  glaucoma and other disorders of the eye. Is the patient up to date with their annual eye exam?  Yes  Who is the provider or what is the name of the office in which the patient attends annual eye exams? [facility] If pt is not established with a provider, would they like to be referred to a provider to establish care? No .   Dental Screening: Recommended annual dental exams for proper oral hygiene  Community Resource Referral / Chronic Care Management:  CRR required this visit?  No   CCM required this visit?  No      Plan:     I have personally reviewed and noted the following in the patient's chart:   Medical and social history Use of alcohol, tobacco or illicit drugs  Current medications and supplements including opioid prescriptions. Patient is currently taking opioid prescriptions. Information provided to patient regarding non-opioid alternatives. Patient advised to discuss non-opioid treatment plan with their provider. Functional ability and status Nutritional status Physical activity Advanced directives List of other physicians Hospitalizations, surgeries, and ER visits in previous 12 months Vitals Screenings to include cognitive, depression, and falls Referrals  and appointments  In addition, I have reviewed and discussed with patient certain preventive protocols, quality metrics, and best practice recommendations. A written personalized care plan for preventive services as well as general preventive health recommendations were provided to patient.     [name], LPN   [id number]   Nurse Notes: None

## 2022-08-06 ENCOUNTER — Encounter: Payer: Self-pay | Admitting: Emergency Medicine

## 2022-08-06 ENCOUNTER — Other Ambulatory Visit: Payer: Self-pay

## 2022-08-06 ENCOUNTER — Ambulatory Visit
Admission: EM | Admit: 2022-08-06 | Discharge: 2022-08-06 | Disposition: A | Discharge status: Home / Self Care | Payer: Medicare Other | Attending: Physician Assistant | Admitting: Physician Assistant

## 2022-08-06 DIAGNOSIS — R062 Wheezing: Secondary | ICD-10-CM | POA: Insufficient documentation

## 2022-08-06 DIAGNOSIS — Z1152 Encounter for screening for COVID-19: Secondary | ICD-10-CM | POA: Diagnosis not present

## 2022-08-06 DIAGNOSIS — J22 Unspecified acute lower respiratory infection: Secondary | ICD-10-CM | POA: Insufficient documentation

## 2022-08-06 DIAGNOSIS — J189 Pneumonia, unspecified organism: Secondary | ICD-10-CM | POA: Insufficient documentation

## 2022-08-06 DIAGNOSIS — R509 Fever, unspecified: Secondary | ICD-10-CM | POA: Diagnosis not present

## 2022-08-06 LAB — POCT INFLUENZA A/B
Influenza A, POC: NEGATIVE
Influenza B, POC: NEGATIVE

## 2022-08-06 MED ORDER — AZITHROMYCIN 250 MG PO TABS: ORAL_TABLET | ORAL | 0 refills | Status: DC

## 2022-08-06 MED ORDER — SPACER/AERO-HOLDING CHAMBERS DEVI: 1.0000 [IU] | Freq: Three times a day (TID) | 0 refills | Status: DC

## 2022-08-06 MED ORDER — ACETAMINOPHEN 325 MG PO TABS
650.0000 mg | ORAL_TABLET | Freq: Once | ORAL | Status: AC
  Administered 2022-08-06: 650 mg via ORAL

## 2022-08-06 MED ORDER — ALBUTEROL SULFATE (2.5 MG/3ML) 0.083% IN NEBU
2.5000 mg | INHALATION_SOLUTION | Freq: Once | RESPIRATORY_TRACT | Status: AC
  Administered 2022-08-06: 2.5 mg via RESPIRATORY_TRACT

## 2022-08-06 MED ORDER — DM-GUAIFENESIN ER 30-600 MG PO TB12: 1.0000 | ORAL_TABLET | Freq: Two times a day (BID) | ORAL | 0 refills | Status: DC

## 2022-08-06 MED ORDER — ALBUTEROL SULFATE HFA 108 (90 BASE) MCG/ACT IN AERS: 2.0000 | INHALATION_SPRAY | Freq: Four times a day (QID) | RESPIRATORY_TRACT | 0 refills | Status: DC | PRN

## 2022-08-06 MED ORDER — PREDNISONE 10 MG PO TABS: 10.0000 mg | ORAL_TABLET | Freq: Three times a day (TID) | ORAL | 0 refills | Status: DC

## 2022-08-06 NOTE — ED Triage Notes (Signed)
Pt here for cough and chest congestion x 1 week with fever noted today

## 2022-08-06 NOTE — ED Provider Notes (Signed)
[facility]    CSN: [id number] Arrival date & time: 08/06/22  1108      History   Chief Complaint Chief Complaint  Patient presents with   Fever    HPI Whitney Payne is a 77 y.o. female.   77 year old female presents with fever, cough, wheezing, fatigue.  Patient indicates for the past week she has been having progressive chest congestion, cough, production which has been yellow to greenish.  Patient also indicates over the past 2 to 3 days she has been having intermittent wheezing and shortness of breath.  Patient also indicates that over the past 48 hours she has started running fever 102-103.  She also indicates being short of breath, when sitting, but especially when she gets up and becomes active.  She also indicates that she is having some upper respiratory congestion with some rhinitis postnasal drip mainly clear production.  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  She is without nausea, vomiting, and is tolerating fluids well.   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  on mri scan of head incidental finding  11/05/2013   Knee pain 11/05/2013   Dizziness and giddiness 05/14/2013   Menopausal hot flushes 07/08/2012   Medication management 07/08/2012   EPICONDYLITIS, RIGHT 08/25/2008   INCREASED BLOOD PRESSURE 08/25/2008   SLEEPLESSNESS 11/27/2007   BACK PAIN 01/20/2007   CHEST PAIN 01/20/2007   THYROID STIMULATING HORMONE, ABNORMAL 01/19/2007   HEADACHE 01/19/2007    Past Surgical History:  Procedure Laterality Date   ABDOMINAL HYSTERECTOMY      CATARACT EXTRACTION     left knee arthroscopy      TOTAL KNEE ARTHROPLASTY Right 11/29/2021   Procedure: TOTAL KNEE ARTHROPLASTY;  Surgeon: Paralee Cancel, MD;  Location: [facility];  Service: Orthopedics;  Laterality: Right;   TUBAL LIGATION      OB History     Gravida  2   Para  2   Term      Preterm      AB      Living         SAB      IAB      Ectopic      Multiple      Live Births               Home Medications    Prior to Admission medications   Medication Sig Start Date End Date Taking? Authorizing Provider  albuterol (VENTOLIN HFA) 108 (90 Base) MCG/ACT inhaler Inhale 2 puffs into the lungs every 6 (six) hours as needed for wheezing or shortness of breath. 08/06/22  Yes Nyoka Lint, PA-C  azithromycin (ZITHROMAX Z-PAK) 250 MG tablet 2 tablets initially and then 1 tablet daily until completed. 08/06/22  Yes Nyoka Lint, PA-C  dextromethorphan-guaiFENesin [facility] DM) 30-600 MG 12hr tablet Take 1 tablet by mouth 2 (two) times daily. 08/06/22  Yes Nyoka Lint, PA-C  predniSONE (DELTASONE) 10 MG tablet Take 1 tablet (10 mg total) by  mouth in the morning, at noon, and at bedtime. 08/06/22  Yes Nyoka Lint, PA-C  Spacer/Aero-Holding Chambers DEVI 1 Units by Does not apply route in the morning, at noon, and at bedtime. 08/06/22  Yes Nyoka Lint, PA-C  albuterol (VENTOLIN HFA) 108 (90 Base) MCG/ACT inhaler Inhale 1-2 puffs into the lungs every 6 (six) hours as needed for wheezing or shortness of breath.    [provider]  Calcium Citrate-Vitamin D (CALCIUM + D PO) Take 1 tablet by mouth daily.    [provider]  celecoxib (CELEBREX) 200 MG capsule Take 1 capsule (200 mg total) by mouth daily. 11/30/21   Irving Copas, PA-C  docusate sodium (COLACE) 100 MG capsule Take 1 capsule (100 mg total) by mouth 2 (two) times daily. 11/30/21   Irving Copas, PA-C  Homeopathic Products [facility] RELIEF EX) Apply 1 Application topically daily as needed (pain).     [provider]  HYDROcodone-acetaminophen (NORCO) 7.5-325 MG tablet Take 1 tablet by mouth every 4 (four) hours as needed for severe pain. 11/30/21   Irving Copas, PA-C  Melatonin 10 MG CAPS Take 10 mg by mouth at bedtime as needed (sleep).    [provider]  methocarbamol (ROBAXIN) 500 MG tablet Take 1 tablet (500 mg total) by mouth every 6 (six) hours as needed for muscle spasms. 11/30/21   Irving Copas, PA-C  polyethylene glycol (MIRALAX / GLYCOLAX) 17 g packet Take 17 g by mouth daily as needed for mild constipation. 11/30/21   Irving Copas, PA-C  vitamin B-12 (CYANOCOBALAMIN) 500 MCG tablet Take 500 mcg by mouth daily.    [provider]    Family History Family History  Problem Relation Age of Onset   Other Mother        died of se of patch narcotic, had artery diesease and amputation   Other Father        collapsed lung- smoker    Social History Social History   Tobacco Use   Smoking status: Former    Types: Cigarettes    Quit date: 10/14/1963    Years since quitting: 58.8   Smokeless tobacco: Never  Vaping Use   Vaping Use: Never used  Substance Use Topics   Alcohol use: Yes    Comment: occasionally   Drug use: No     Allergies   Penicillins and Tizanidine   Review of Systems Review of Systems  Constitutional:  Positive for fever.  Respiratory:  Positive for shortness of breath and wheezing.      Physical Exam Triage Vital Signs ED Triage Vitals  Enc Vitals Group     BP 08/06/22 1124 (!) 167/93     Pulse Rate 08/06/22 1124 (!) 120     Resp 08/06/22 1124 18     Temp 08/06/22 1124 (!) 103.1 F (39.5 C)     Temp Source 08/06/22 1124 Oral     SpO2 08/06/22 1124 91 %     Weight --      Height --      Head Circumference --      Peak Flow --      Pain Score 08/06/22 1125 2     Pain Loc --      Pain Edu? --      Excl. in [place]? --    No data found.  Updated Vital Signs BP (!) 167/93 (BP Location: Left Arm)    Pulse (!) 123   Temp (!)  103.1 F (39.5 C) (Oral)   Resp 18   SpO2 90%   Visual Acuity Right Eye Distance:   Left Eye Distance:   Bilateral Distance:    Right Eye Near:   Left Eye Near:    Bilateral Near:     Physical Exam Constitutional:      Appearance: Normal appearance.  HENT:     Right Ear: Tympanic membrane and ear canal normal.     Left Ear: Tympanic membrane and ear canal normal.     Mouth/Throat:     Mouth: Mucous membranes are moist.     Pharynx: Oropharynx is clear.  Cardiovascular:     Rate and Rhythm: Regular rhythm. Tachycardia present.     Heart sounds: Normal heart sounds.  Pulmonary:     Effort: Pulmonary effort is normal.     Breath sounds: Decreased air movement (bilat) present. Examination of the right-lower field reveals wheezing. Examination of the left-lower field reveals wheezing. Wheezing (mild bilaterally) present. No rhonchi or rales.  Lymphadenopathy:     Cervical: No cervical adenopathy.  Neurological:     Mental Status: She is alert.      UC Treatments / Results  Labs (all labs ordered are listed, but only abnormal results are displayed) Labs Reviewed  SARS CORONAVIRUS 2 (TAT 6-24 HRS)  POCT INFLUENZA A/B    EKG   Radiology No results found.  Procedures Procedures (including critical care time)  Medications Ordered in UC Medications  acetaminophen (TYLENOL) tablet 650 mg (650 mg Oral Given 08/06/22 1135)  albuterol (PROVENTIL) (2.5 MG/3ML) 0.083% nebulizer solution 2.5 mg (2.5 mg Nebulization Given 08/06/22 1139)    Initial Impression / Assessment and Plan / UC Course  I have reviewed the triage vital signs and the nursing notes.  Pertinent labs & imaging results that were available during my care of the patient were reviewed by me and considered in my medical decision making (see chart for details).  *Patient advised to go and have a chest x-ray done today so that I can review results however patient declined to go to the  x-ray facility and have an x-ray performed today. *Patient advised to report to the emergency room for a higher level of care due to symptoms that are progressive with possible pneumonia.  Patient has declined to report to the emergency room.   Plan: The diagnosis will be treated with the following: 1.  Fever: A.  Advised take ibuprofen or Motrin to help control fever 2.  Wheezing: A.  Advised to use albuterol inhaler with spacer, 2 puffs every 6 hours to help control cough and wheezing. 3.  Lower respiratory infection: A.  Patient advised to take Mucinex DM every 12 hours to control cough and congestion. B.  Patient advised take prednisone 10 mg 3 times a day for 4 days to help control wheezing and shortness of breath. 4.  Pneumonia: A.  Patient advised take the Zithromax 250 mg, 2 tablets today and then 1 tablet daily until completed to treat the probable pneumonia 5.  Advised to call 911 if symptoms fail to improve or worsen over the next 48-72 hours.  Final Clinical Impressions(s) / UC Diagnoses   Final diagnoses:  Fever, unspecified  Wheezing  Lower respiratory infection  Pneumonia due to infectious organism, unspecified laterality, unspecified part of lung  Encounter for screening for COVID-19     Discharge Instructions       Advised to use albuterol inhaler with a spacer, 2 puffs every  6 hours on a regular basis to help decrease cough, congestion and wheezing. Advised to take the Zithromax 250 mg, 2 tablets today and then 1 tablet daily until completed. Advised to take the Mucinex DM every 12 hours to help decrease cough and congestion. Advised take prednisone 10 mg 3 times a day for the next 4 days to help decrease wheezing and shortness of breath. Take Tylenol or Motrin as needed for fever.  Advised if symptoms fail to improve or worsen over the next 48-72 hours then please call 911 for assistance.     ED Prescriptions     Medication Sig Dispense Auth. Provider    albuterol (VENTOLIN HFA) 108 (90 Base) MCG/ACT inhaler Inhale 2 puffs into the lungs every 6 (six) hours as needed for wheezing or shortness of breath. 8 g Nyoka Lint, PA-C   Spacer/Aero-Holding Chambers DEVI 1 Units by Does not apply route in the morning, at noon, and at bedtime. 1 Units Nyoka Lint, PA-C   dextromethorphan-guaiFENesin [facility] DM) 30-600 MG 12hr tablet Take 1 tablet by mouth 2 (two) times daily. 20 tablet Nyoka Lint, PA-C   azithromycin (ZITHROMAX Z-PAK) 250 MG tablet 2 tablets initially and then 1 tablet daily until completed. 6 each Nyoka Lint, PA-C   predniSONE (DELTASONE) 10 MG tablet Take 1 tablet (10 mg total) by mouth in the morning, at noon, and at bedtime. 12 tablet Nyoka Lint, PA-C      PDMP not reviewed this encounter.   Nyoka Lint, PA-C 08/06/22 1207

## 2022-08-06 NOTE — Discharge Instructions (Addendum)
Advised to use albuterol inhaler with a spacer, 2 puffs every 6 hours on a regular basis to help decrease cough, congestion and wheezing. Advised to take the Zithromax 250 mg, 2 tablets today and then 1 tablet daily until completed. Advised to take the Mucinex DM every 12 hours to help decrease cough and congestion. Advised take prednisone 10 mg 3 times a day for the next 4 days to help decrease wheezing and shortness of breath. Take Tylenol or Motrin as needed for fever.  Advised if symptoms fail to improve or worsen over the next 48-72 hours then please call 911 for assistance.

## 2022-08-07 DIAGNOSIS — M19011 Primary osteoarthritis, right shoulder: Secondary | ICD-10-CM | POA: Diagnosis not present

## 2022-08-07 LAB — SARS CORONAVIRUS 2 (TAT 6-24 HRS): SARS Coronavirus 2: NEGATIVE

## 2023-02-17 IMAGING — DX DG CHEST 2V
2 series · 2 of 2 positions shown · non-contrast
Comparison: 01/10/2021

CLINICAL DATA: Follow-up left upper lobe pneumonia

EXAM:
CHEST - 2 VIEW

[chest pa]
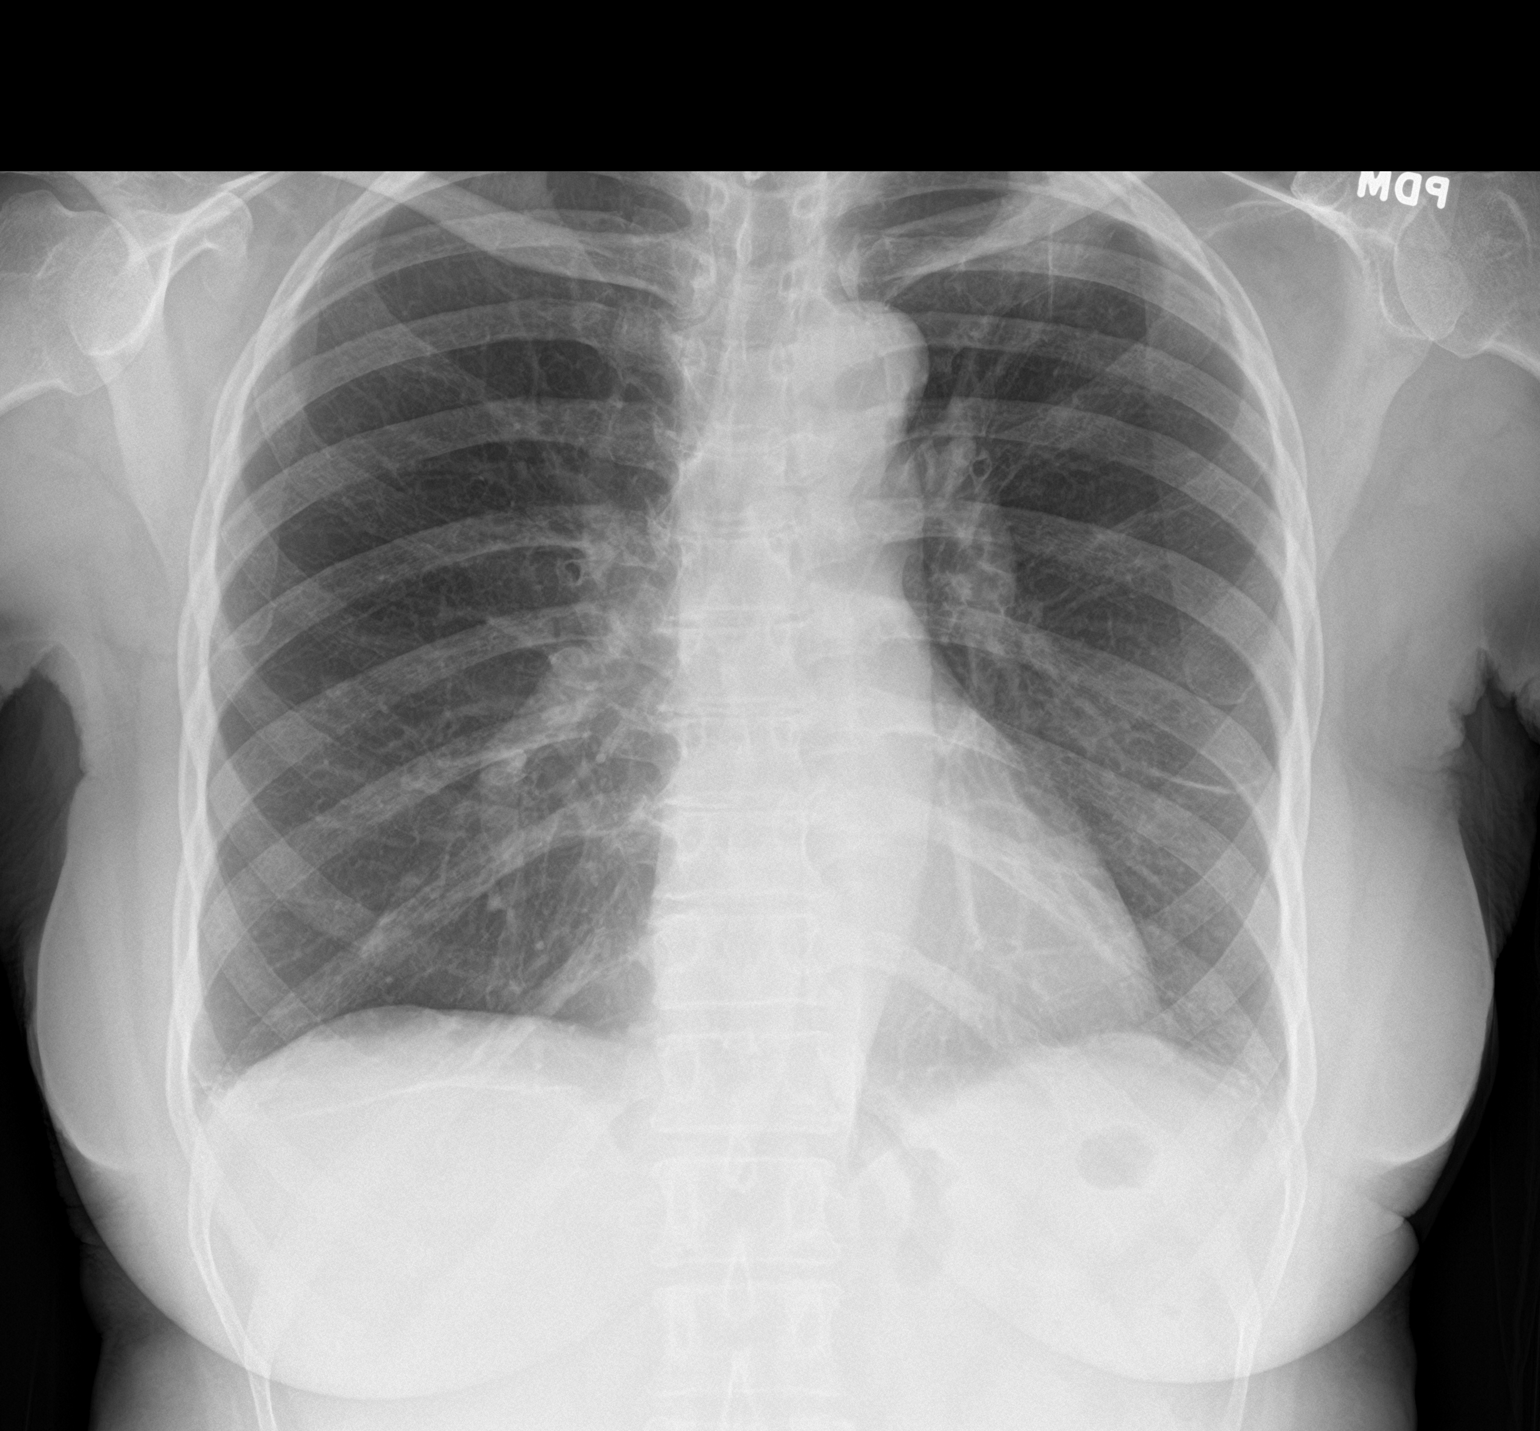

[chest lat]
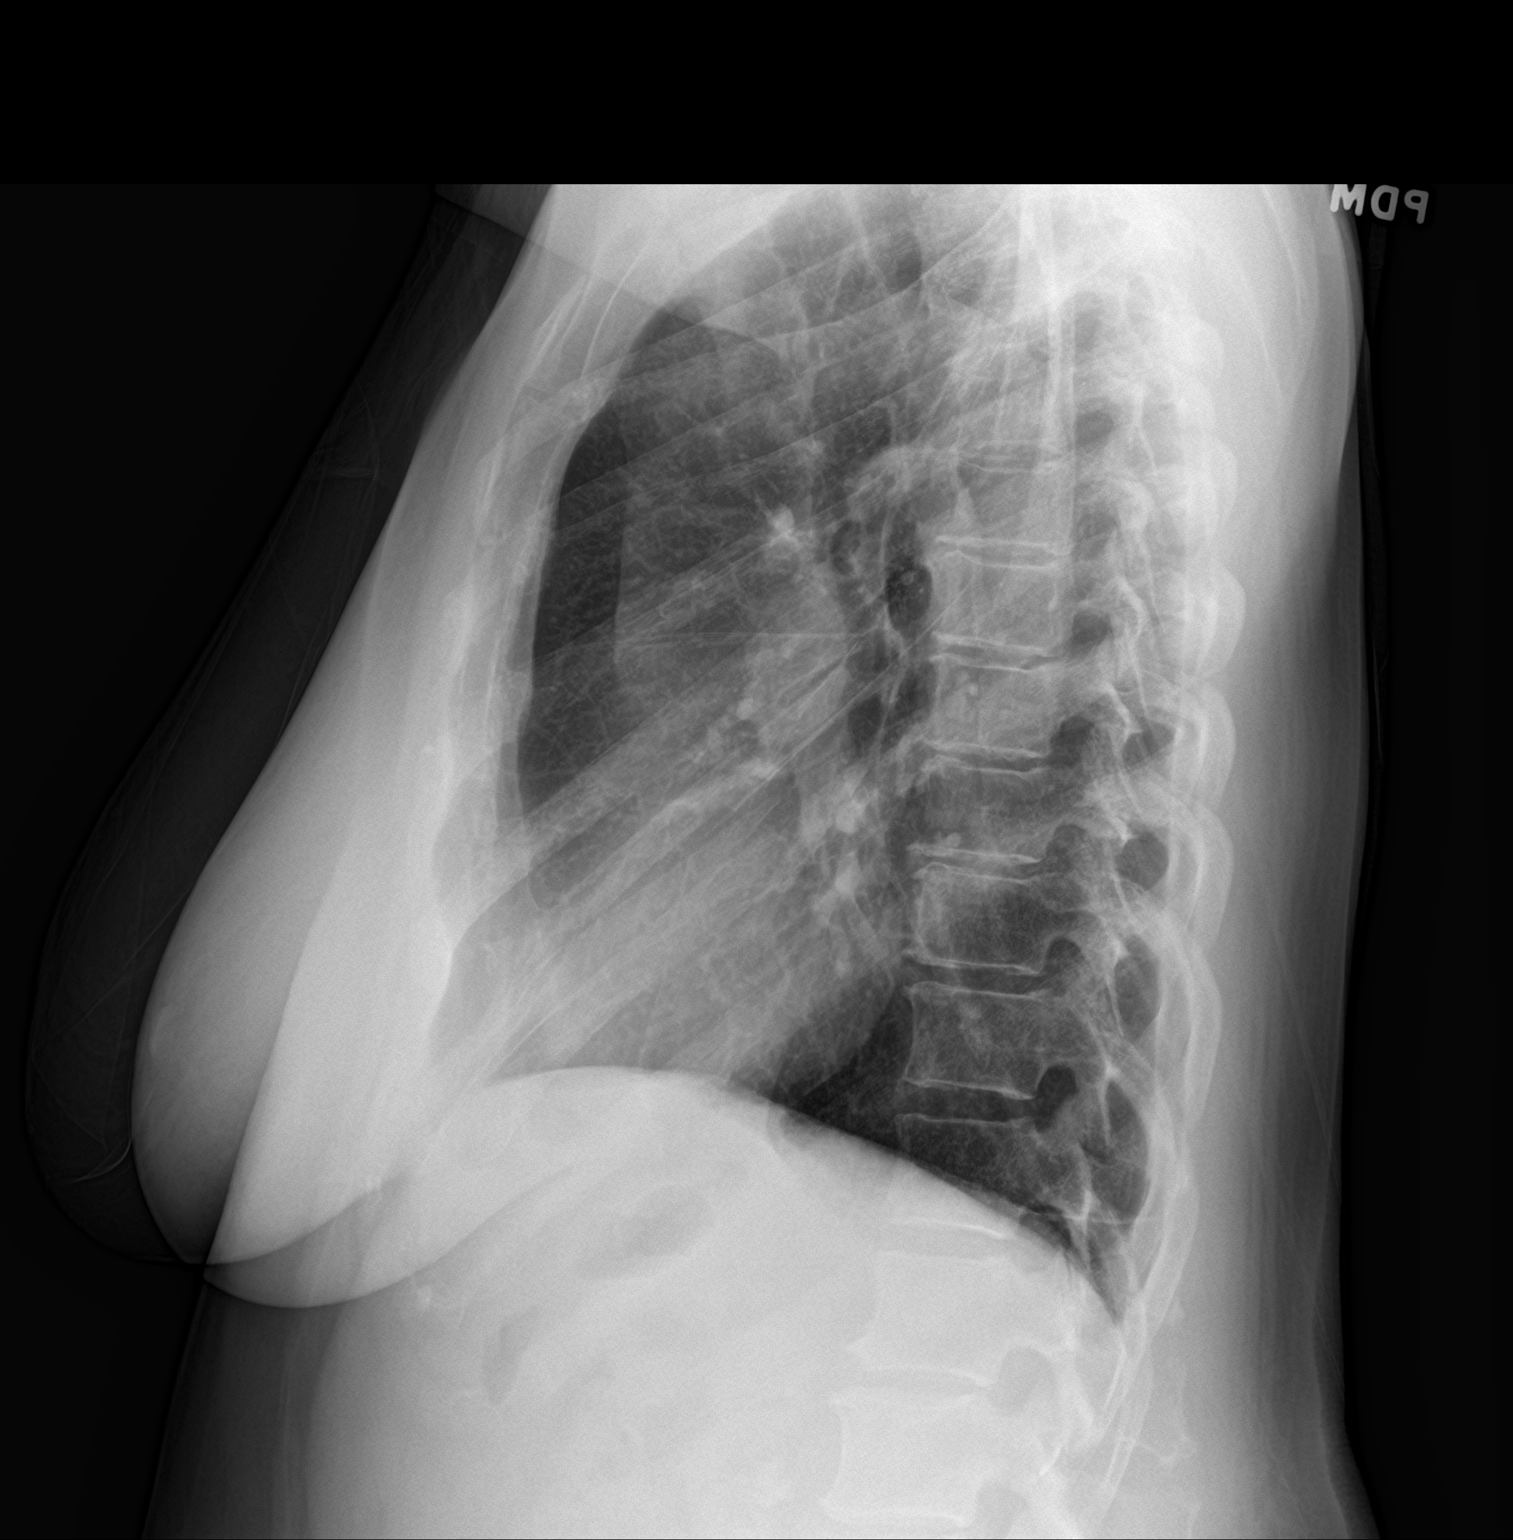

[2 of 2 positions shown; findings below may reference images not displayed]

FINDINGS: Previously seen left upper lobe density has improved. Residual left
apical density is noted. Recommend continued follow-up to
resolution. If this area does not fully resolve, may need chest CT.

Linear areas of atelectasis in the left mid to lower lung and right
lung base. Heart is normal size. No effusions.
IMPRESSION: Improving left upper lobe opacity, but persistent density remains in
the left apex. Recommend continued follow-up with repeat chest x-ray
in 3-4 weeks to assure full resolution. If this does not fully
resolve at that time, may need chest CT for further evaluation.

## 2023-06-06 ENCOUNTER — Telehealth: Payer: Self-pay | Admitting: Internal Medicine

## 2023-06-06 NOTE — Telephone Encounter (Signed)
 Called pt to sch annual physical, left vm. Pls sch pt for physical appt.

## 2023-07-23 ENCOUNTER — Ambulatory Visit (INDEPENDENT_AMBULATORY_CARE_PROVIDER_SITE_OTHER): Payer: Medicare Other

## 2023-07-23 VITALS — Ht 63.0 in | Wt 135.0 lb

## 2023-07-23 DIAGNOSIS — Z0000 Encounter for general adult medical examination without abnormal findings: Secondary | ICD-10-CM

## 2023-07-23 NOTE — Progress Notes (Signed)
Subjective:   Whitney Payne is a 78 y.o. female who presents for Medicare Annual (Subsequent) preventive examination.  Visit Complete: Virtual I connected with  Lorie Apley on 07/23/23 by a audio enabled telemedicine application and verified that I am speaking with the correct person using two identifiers.  Patient Location: Home  Provider Location: Home Office  I discussed the limitations of evaluation and management by telemedicine. The patient expressed understanding and agreed to proceed.  Vital Signs: Because this visit was a virtual/telehealth visit, some criteria may be missing or patient reported. Any vitals not documented were not able to be obtained and vitals that have been documented are patient reported.   Cardiac Risk Factors include: advanced age (>82men, >82 women)     Objective:    Today's Vitals   07/23/23 1045  Weight: 135 lb (61.2 kg)  Height: 5\' 3"  (1.6 m)   Body mass index is 23.91 kg/m.     07/23/2023   10:54 AM 07/15/2022   10:30 AM 11/29/2021    3:14 PM 11/23/2021   10:59 AM 07/09/2021   10:34 AM  Advanced Directives  Does Patient Have a Medical Advance Directive? Yes Yes Yes Yes Yes  Type of Estate agent of [place];Living will Healthcare Power of [place];Living will Healthcare Power of [place];Living will Healthcare Power of [place];Living will Healthcare Power of [place];Living will  Does patient want to make changes to medical advance directive?   No - Patient declined    Copy of Healthcare Power of Attorney in Chart? No - copy requested No - copy requested No - copy requested  No - copy requested    Current Medications (verified) Outpatient Encounter Medications as of 07/23/2023  Medication Sig   albuterol (VENTOLIN HFA) 108 (90 Base) MCG/ACT inhaler Inhale 1-2 puffs into the lungs every 6 (six) hours as needed for wheezing or shortness of breath.   Calcium Citrate-Vitamin D (CALCIUM + D PO) Take 1 tablet by  mouth daily.   albuterol (VENTOLIN HFA) 108 (90 Base) MCG/ACT inhaler Inhale 2 puffs into the lungs every 6 (six) hours as needed for wheezing or shortness of breath. (Patient not taking: Reported on 07/23/2023)   azithromycin (ZITHROMAX Z-PAK) 250 MG tablet 2 tablets initially and then 1 tablet daily until completed. (Patient not taking: Reported on 07/23/2023)   celecoxib (CELEBREX) 200 MG capsule Take 1 capsule (200 mg total) by mouth daily. (Patient not taking: Reported on 07/23/2023)   dextromethorphan-guaiFENesin (MUCINEX DM) 30-600 MG 12hr tablet Take 1 tablet by mouth 2 (two) times daily. (Patient not taking: Reported on 07/23/2023)   docusate sodium (COLACE) 100 MG capsule Take 1 capsule (100 mg total) by mouth 2 (two) times daily. (Patient not taking: Reported on 07/23/2023)   Homeopathic Products [facility] RELIEF EX) Apply 1 Application topically daily as needed (pain).   HYDROcodone-acetaminophen (NORCO) 7.5-325 MG tablet Take 1 tablet by mouth every 4 (four) hours as needed for severe pain. (Patient not taking: Reported on 07/23/2023)   Melatonin 10 MG CAPS Take 10 mg by mouth at bedtime as needed (sleep).   methocarbamol (ROBAXIN) 500 MG tablet Take 1 tablet (500 mg total) by mouth every 6 (six) hours as needed for muscle spasms. (Patient not taking: Reported on 07/23/2023)   polyethylene glycol (MIRALAX / GLYCOLAX) 17 g packet Take 17 g by mouth daily as needed for mild constipation.   predniSONE (DELTASONE) 10 MG tablet Take 1 tablet (10 mg total) by mouth in the morning, at  noon, and at bedtime. (Patient not taking: Reported on 07/23/2023)   Spacer/Aero-Holding Chambers DEVI 1 Units by Does not apply route in the morning, at noon, and at bedtime. (Patient not taking: Reported on 07/23/2023)   vitamin B-12 (CYANOCOBALAMIN) 500 MCG tablet Take 500 mcg by mouth daily.   No facility-administered encounter medications on file as of 07/23/2023.    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   Procedure: TOTAL KNEE ARTHROPLASTY;  Surgeon: Durene Romans, MD;  Location: [facility];  Service: Orthopedics;  Laterality: Right;   TUBAL LIGATION     Family History  Problem Relation Age of Onset   Other Mother        died of se of patch narcotic, had artery diesease and amputation   Other Father        collapsed lung- smoker   Social History   Socioeconomic History   Marital status: Widowed    Spouse name: Not on file   Number of children: Not on file   Years of education: Not on file   Highest education level: Not on file  Occupational History   Not on file  Tobacco Use   Smoking status: Former    Current packs/day: 0.00    Types: Cigarettes    Quit date: 10/14/1963    Years since quitting: 59.8   Smokeless tobacco: Never  Vaping Use   Vaping status: Never Used  Substance and Sexual Activity   Alcohol use: Yes    Comment: occasionally   Drug use: No   Sexual activity: Not on file  Other Topics Concern   Not on file  Social History Narrative   Separated   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 work   Exercising ok   Neg tad    Social Drivers of Corporate investment banker Strain: Low Risk  (07/23/2023)   Overall Financial Resource Strain (CARDIA)    Difficulty of Paying Living Expenses: Not hard at all  Food Insecurity: No Food Insecurity (07/23/2023)   Hunger Vital Sign    Worried About Running Out of Food in the Last Year: Never true    Ran Out of Food in the Last Year: Never true  Transportation Needs: No Transportation Needs (07/23/2023)    PRAPARE - Administrator, Civil Service (Medical): No    Lack of Transportation (Non-Medical): No  Physical Activity: Sufficiently Active (07/23/2023)   Exercise Vital Sign    Days of Exercise per Week: 7 days    Minutes of Exercise per Session: 30 min  Stress: No Stress Concern Present (07/23/2023)   [employer] of Occupational Health - Occupational Stress Questionnaire    Feeling of Stress : Not at all  Social Connections: Moderately Integrated (07/23/2023)   Social Connection and Isolation Panel [NHANES]    Frequency of Communication with Friends and Family: More than three times a week    Frequency of Social Gatherings with Friends and Family: More than three times a  week    Attends Religious Services: More than 4 times per year    Active Member of Clubs or Organizations: Yes    Attends Banker Meetings: More than 4 times per year    Marital Status: Widowed    Tobacco Counseling Counseling given: Not Answered   Clinical Intake:  Pre-visit preparation completed: Yes  Pain : No/denies pain     BMI - recorded: 23.91 Nutritional Status: BMI of 19-24  Normal Nutritional Risks: None Diabetes: No  How often do you need to have someone help you when you read instructions, pamphlets, or other written materials from your doctor or pharmacy?: 1 - Never  Interpreter Needed?: No  Information entered by :: Theresa Mulligan LPN   Activities of Daily Living    07/23/2023   10:53 AM  In your present state of health, do you have any difficulty performing the following activities:  Hearing? 0  Vision? 0  Difficulty concentrating or making decisions? 0  Walking or climbing stairs? 0  Dressing or bathing? 0  Doing errands, shopping? 0  Preparing Food and eating ? N  Using the Toilet? N  In the past six months, have you accidently leaked urine? N  Do you have problems with loss of bowel control? N  Managing your Medications? N  Managing your Finances?  N  Housekeeping or managing your Housekeeping? N    Patient Care Team: Panosh, Neta Mends, MD as PCP - General Durene Romans, MD as Consulting Physician (Orthopedic Surgery)  Indicate any recent Medical Services you may have received from other than [facility] providers in the past year (date may be approximate).     Assessment:   This is a routine wellness examination for [place].  Hearing/Vision screen Hearing Screening - Comments:: Denies hearing difficulties   Vision Screening - Comments:: Wears rx glasses - up to date with routine eye exams with  [facility]   Goals Addressed             This Visit's Progress    Stay Active         Depression Screen    07/23/2023   10:52 AM 07/15/2022   10:28 AM 10/10/2021   10:18 AM 07/09/2021   10:35 AM 09/06/2019    2:30 PM 07/10/2018    9:31 AM 12/03/2017    1:47 PM  PHQ 2/9 Scores  PHQ - 2 Score 0 0 0 0 0 0 0  PHQ- 9 Score   0  0      Fall Risk    07/23/2023   10:53 AM 07/15/2022   10:29 AM 10/10/2021   10:18 AM 07/09/2021   10:35 AM 02/14/2021    3:36 PM  Fall Risk   Falls in the past year? 0 1 0 0 0  Number falls in past yr: 0 0 0  0  Injury with Fall? 0 1 0  0  Comment  Skin tear to forehead, No medical attention needed     Risk for fall due to : No Fall Risks No Fall Risks No Fall Risks No Fall Risks   Follow up Falls prevention discussed;Falls evaluation completed Falls prevention discussed Falls evaluation completed Falls evaluation completed;Education provided;Falls prevention discussed     MEDICARE RISK AT HOME: Medicare Risk at Home Any stairs in or around the home?: Yes If so, are there any without handrails?: No Home free of loose throw rugs in walkways, pet beds, electrical cords, etc?: Yes Adequate lighting in  your home to reduce risk of falls?: Yes Life alert?: Yes Use of a cane, walker or w/c?: No Grab bars in the bathroom?: Yes Shower chair or bench in shower?: Yes Elevated toilet seat or a handicapped  toilet?: Yes  TIMED UP AND GO:  Was the test performed?  No    Cognitive Function:        07/23/2023   10:54 AM 07/15/2022   10:31 AM 07/09/2021   10:37 AM  6CIT Screen  What Year? 0 points 0 points 0 points  What month? 0 points 0 points 0 points  What time? 0 points 0 points 0 points  Count back from 20 0 points 2 points 0 points  Months in reverse 0 points 4 points 4 points  Repeat phrase 0 points 0 points 0 points  Total Score 0 points 6 points 4 points    Immunizations Immunization History  Administered Date(s) Administered   Fluad Quad(high Dose 65+) 05/12/2021   Influenza-Unspecified 07/01/2023   PFIZER(Purple Top)SARS-COV-2 Vaccination 07/25/2019, 08/18/2019, 04/12/2020, 11/29/2020   Zoster Recombinant(Shingrix) 12/31/2018, 01/12/2020    TDAP status: Due, Education has been provided regarding the importance of this vaccine. Advised may receive this vaccine at local pharmacy or Health Dept. Aware to provide a copy of the vaccination record if obtained from local pharmacy or Health Dept. Verbalized acceptance and understanding.  Flu Vaccine status: Up to date  Pneumococcal vaccine status: Due, Education has been provided regarding the importance of this vaccine. Advised may receive this vaccine at local pharmacy or Health Dept. Aware to provide a copy of the vaccination record if obtained from local pharmacy or Health Dept. Verbalized acceptance and understanding.  Covid-19 vaccine status: Declined, Education has been provided regarding the importance of this vaccine but patient still declined. Advised may receive this vaccine at local pharmacy or Health Dept.or vaccine clinic. Aware to provide a copy of the vaccination record if obtained from local pharmacy or Health Dept. Verbalized acceptance and understanding.  Qualifies for Shingles Vaccine? Yes   Zostavax completed Yes   Shingrix Completed?: Yes  Screening Tests Health Maintenance  Topic Date Due   DTaP/Tdap/Td  (1 - Tdap) Never done   Pneumonia Vaccine 54+ Years old (1 of 1 - PCV) Never done   COVID-19 Vaccine (5 - 2024-25 season) 02/02/2023   Medicare Annual Wellness (AWV)  07/22/2024   INFLUENZA VACCINE  Completed   DEXA SCAN  Completed   Hepatitis C Screening  Completed   Zoster Vaccines- Shingrix  Completed   HPV VACCINES  Aged Out    Health Maintenance  Health Maintenance Due  Topic Date Due   DTaP/Tdap/Td (1 - Tdap) Never done   Pneumonia Vaccine 27+ Years old (1 of 1 - PCV) Never done   COVID-19 Vaccine (5 - 2024-25 season) 02/02/2023        Bone Density status: Completed 09/10/19. Results reflect: Bone density results: NORMAL. Repeat every   years.     Additional Screening:  Hepatitis C Screening: does qualify; Completed 12/03/17  Vision Screening: Recommended annual ophthalmology exams for early detection of glaucoma and other disorders of the eye. Is the patient up to date with their annual eye exam?  Yes  Who is the provider or what is the name of the office in which the patient attends annual eye exams? [facility] If pt is not established with a provider, would they like to be referred to a provider to establish care? No .   Dental Screening:  Recommended annual dental exams for proper oral hygiene    Community Resource Referral / Chronic Care Management:  CRR required this visit?  No   CCM required this visit?  No     Plan:     I have personally reviewed and noted the following in the patient's chart:   Medical and social history Use of alcohol, tobacco or illicit drugs  Current medications and supplements including opioid prescriptions. Patient is not currently taking opioid prescriptions. Functional ability and status Nutritional status Physical activity Advanced directives List of other physicians Hospitalizations, surgeries, and ER visits in previous 12 months Vitals Screenings to include cognitive, depression, and falls Referrals and  appointments  In addition, I have reviewed and discussed with patient certain preventive protocols, quality metrics, and best practice recommendations. A written personalized care plan for preventive services as well as general preventive health recommendations were provided to patient.     Tillie Rung, LPN   1/61/0960   After Visit Summary: (MyChart) Due to this being a telephonic visit, the after visit summary with patients personalized plan was offered to patient via MyChart   Nurse Notes: None

## 2023-07-23 NOTE — Patient Instructions (Addendum)
Whitney Payne , Thank you for taking time to come for your Medicare Wellness Visit. I appreciate your ongoing commitment to your health goals. Please review the following plan we discussed and let me know if I can assist you in the future.   Referrals/Orders/Follow-Ups/Clinician Recommendations:   This is a list of the screening recommended for you and due dates:  Health Maintenance  Topic Date Due   DTaP/Tdap/Td vaccine (1 - Tdap) Never done   Pneumonia Vaccine (1 of 1 - PCV) Never done   COVID-19 Vaccine (5 - 2024-25 season) 02/02/2023   Medicare Annual Wellness Visit  07/22/2024   Flu Shot  Completed   DEXA scan (bone density measurement)  Completed   Hepatitis C Screening  Completed   Zoster (Shingles) Vaccine  Completed   HPV Vaccine  Aged Out    Advanced directives: (Copy Requested) Please bring a copy of your health care power of attorney and living will to the office to be added to your chart at your convenience.  Next Medicare Annual Wellness Visit scheduled for next year: Yes

## 2023-08-25 ENCOUNTER — Ambulatory Visit: Payer: Self-pay | Admitting: Internal Medicine

## 2023-08-25 ENCOUNTER — Telehealth: Payer: Self-pay

## 2023-08-25 NOTE — Telephone Encounter (Signed)
 Chief Complaint: Cough/lightheadedness Symptoms: see above Frequency: A few weeks Pertinent Negatives: Patient denies fever Disposition: [] ED /[] Urgent Care (no appt availability in office) / [x] Appointment(In office/virtual)/ []  [place] [facility]/ [] Home Care/ [] Refused Recommended Disposition /[] [place] [facility]/ []  Follow-up with PCP Additional Notes: Patient called to state she has been coughing for a few weeks, and initially it was due to a cold/flu symptoms but has progressed and is worsened when eating. Patient states she coughs up a frothy foam. Patient also has had mild lightheadedness that she believes stems from the coughing episodes. Patient suggested to come into office for further evaluation, but states it will have to be on Wednesday due to transportation needs. Appt scheduled for Wednesday. Advised patient to call back if symptoms worsen.   Copied from CRM [id number]. Topic: Clinical - Red Word Triage >> Aug 25, 2023 10:13 AM Denese Killings wrote: Red Word that prompted transfer to Nurse Triage: Patient states that she is lightheaded and still has a lingering cough. Reason for Disposition  Cough has been present for > 3 weeks  Answer Assessment - Initial Assessment Questions 1. ONSET: "When did the cough begin?"      A few weeks 2. SEVERITY: "How bad is the cough today?"      Occurs when eating  3. SPUTUM: "Describe the color of your sputum" (none, dry cough; clear, white, yellow, green)     Foamy phlegm when coughing  4. HEMOPTYSIS: "Are you coughing up any blood?" If so ask: "How much?" (flecks, streaks, tablespoons, etc.)     No 5. DIFFICULTY BREATHING: "Are you having difficulty breathing?" If Yes, ask: "How bad is it?" (e.g., mild, moderate, severe)    - MILD: No SOB at rest, mild SOB with walking, speaks normally in sentences, can lie down, no retractions, pulse < 100.    - MODERATE: SOB at rest, SOB with minimal exertion and prefers to sit, cannot lie down flat,  speaks in phrases, mild retractions, audible wheezing, pulse 100-120.    - SEVERE: Very SOB at rest, speaks in single words, struggling to breathe, sitting hunched forward, retractions, pulse > 120      No 6. FEVER: "Do you have a fever?" If Yes, ask: "What is your temperature, how was it measured, and when did it start?"     No 7. CARDIAC HISTORY: "Do you have any history of heart disease?" (e.g., heart attack, congestive heart failure)      No 8. LUNG HISTORY: "Do you have any history of lung disease?"  (e.g., pulmonary embolus, asthma, emphysema)     No 9. PE RISK FACTORS: "Do you have a history of blood clots?" (or: recent major surgery, recent prolonged travel, bedridden)     No 10. OTHER SYMPTOMS: "Do you have any other symptoms?" (e.g., runny nose, wheezing, chest pain)       Lightheadedness/dizzy when coughing a lot  Protocols used: Cough - Acute Productive-A-AH

## 2023-08-25 NOTE — Telephone Encounter (Signed)
 Copied from CRM [id number]. Topic: Clinical - Request for Lab/Test Order >> Aug 25, 2023 10:11 AM Whitney Payne wrote: Reason for CRM: Patient is requesting a chest xray. She states that when she eat she is coughing and cough all the time from time to time.

## 2023-08-27 ENCOUNTER — Ambulatory Visit (INDEPENDENT_AMBULATORY_CARE_PROVIDER_SITE_OTHER): Admitting: Adult Health

## 2023-08-27 VITALS — BP 160/80 | HR 101 | Temp 98.5°F | Ht 63.0 in | Wt 138.0 lb

## 2023-08-27 DIAGNOSIS — R030 Elevated blood-pressure reading, without diagnosis of hypertension: Secondary | ICD-10-CM

## 2023-08-27 DIAGNOSIS — K2100 Gastro-esophageal reflux disease with esophagitis, without bleeding: Secondary | ICD-10-CM | POA: Diagnosis not present

## 2023-08-27 DIAGNOSIS — R42 Dizziness and giddiness: Secondary | ICD-10-CM

## 2023-08-27 MED ORDER — PANTOPRAZOLE SODIUM 40 MG PO TBEC: 40.0000 mg | DELAYED_RELEASE_TABLET | Freq: Every day | ORAL | 0 refills | Status: AC

## 2023-08-27 MED ORDER — MECLIZINE HCL 25 MG PO TABS: 25.0000 mg | ORAL_TABLET | Freq: Three times a day (TID) | ORAL | 0 refills | Status: AC | PRN

## 2023-08-27 NOTE — Progress Notes (Signed)
 Subjective:    Patient ID: Whitney Payne, female    DOB: Oct 19, 1945, 78 y.o.   MRN: [id number]  HPI 78 year old female who  has a past medical history of Abnormal EKG, Arthritis, Knee problem, Postmenopausal HRT (hormone replacement therapy), and Recurrent vertigo.  She is a patient of Dr. Fabian Sharp who  I am seeing today for an acute visit.   She reports that over the last 3 weeks she has developed a cough that is present after eating.  At times she will cough up "foam".  Associated symptoms is a burning sensation in her stomach and esophagus after eating. She does not have a cough or burning sensation when she is not eating. She not had any nausea, vomiting, or diarrhea.  At times she feels as though she has trouble swallowing her food but has not gotten choked.  Does not eat a lot of spicy or acidic foods.  He will use Tums and this provides relief symptoms for a short period of time.  She also reports that over the last 2 weeks she has been experiencing dizziness.  She does have a history of vertigo and this dizziness is the same sensation as she has had in the past.  Dizziness presents more frequently with changing positions and when looking up and down. She did have a fall last week from the dizziness but did not have LOC or sustain injury.     Review of Systems See HPI   Past Medical History:  Diagnosis Date   Abnormal EKG    nonspecific ST and T-wave changes   Arthritis    Knee problem    Postmenopausal HRT (hormone replacement therapy)    Recurrent vertigo    neuro eval see mri report    Social History   Socioeconomic History   Marital status: Widowed    Spouse name: Not on file   Number of children: Not on file   Years of education: Not on file   Highest education level: Not on file  Occupational History   Not on file  Tobacco Use   Smoking status: Former    Current packs/day: 0.00    Types: Cigarettes    Quit date: 10/14/1963    Years since quitting: 59.9    Smokeless tobacco: Never  Vaping Use   Vaping status: Never Used  Substance and Sexual Activity   Alcohol use: Yes    Comment: occasionally   Drug use: No   Sexual activity: Not on file  Other Topics Concern   Not on file  Social History Narrative   Separated   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 work   Exercising ok   Neg tad    Social Drivers of Corporate investment banker Strain: Low Risk  (07/23/2023)   Overall Financial Resource Strain (CARDIA)    Difficulty of Paying Living Expenses: Not hard at all  Food Insecurity: No Food Insecurity (07/23/2023)   Hunger Vital Sign    Worried About Running Out of Food in the Last Year: Never true    Ran Out of Food in the Last Year: Never true  Transportation Needs: No Transportation Needs (07/23/2023)   PRAPARE - Administrator, Civil Service (Medical): No    Lack of Transportation (Non-Medical): No  Physical Activity: Sufficiently Active (07/23/2023)   Exercise Vital Sign    Days of Exercise per Week: 7 days    Minutes of Exercise per Session: 30  min  Stress: No Stress Concern Present (07/23/2023)   [employer] of Occupational Health - Occupational Stress Questionnaire    Feeling of Stress : Not at all  Social Connections: Moderately Integrated (07/23/2023)   Social Connection and Isolation Panel [NHANES]    Frequency of Communication with Friends and Family: More than three times a week    Frequency of Social Gatherings with Friends and Family: More than three times a week    Attends Religious Services: More than 4 times per year    Active Member of [employer] or Organizations: Yes    Attends Banker Meetings: More than 4 times per year    Marital Status: Widowed  Intimate Partner Violence: Not At Risk (07/23/2023)   Humiliation, Afraid, Rape, and Kick questionnaire    Fear of Current or Ex-Partner: No    Emotionally Abused: No    Physically Abused: No    Sexually Abused: No    Past  Surgical History:  Procedure Laterality Date   ABDOMINAL HYSTERECTOMY     CATARACT EXTRACTION     left knee arthroscopy      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     Tizanidine Itching, Rash and Other (See Comments)    Feels sick    No current outpatient medications on file prior to visit.   No current facility-administered medications on file prior to visit.    BP (!) 160/80   Pulse (!) 101   Temp 98.5 F (36.9 C) (Oral)   Ht 5\' 3"  (1.6 m)   Wt 138 lb (62.6 kg)   SpO2 93%   BMI 24.45 kg/m       Objective:   Physical Exam Vitals and nursing note reviewed.  Constitutional:      Appearance: Normal appearance.  Eyes:     Extraocular Movements:     Right eye: Nystagmus (horizontal) present.     Left eye: Nystagmus (horizontal) present.  Cardiovascular:     Rate and Rhythm: Normal rate and regular rhythm.     Pulses: Normal pulses.     Heart sounds: Normal heart sounds.  Pulmonary:     Effort: Pulmonary effort is normal.     Breath sounds: Normal breath sounds.  Abdominal:     General: Abdomen is flat. Bowel sounds are normal.     Palpations: Abdomen is soft.     Tenderness: There is no abdominal tenderness.  Musculoskeletal:        General: Normal range of motion.  Skin:    General: Skin is warm and dry.  Neurological:     General: No focal deficit present.     Mental Status: She is alert and oriented to person, place, and time.     Cranial Nerves: Cranial nerves 2-12 are intact.     Sensory: Sensation is intact.      Motor: Motor function is intact.     Coordination: Coordination is intact.  Comments: She was symptomatic with change in position in the office today  Psychiatric:        Mood and Affect: Mood normal.        Behavior: Behavior normal.        Thought Content: Thought content normal.        Judgment: Judgment normal.        Assessment & Plan:  1. Gastroesophageal reflux disease with esophagitis without hemorrhage (Primary) -Start her on Protonix 40 mg daily and have her take it for 30 days.  After 30 days she can stop the medication if she is feeling better.  Advised if no improvement in the next 2 weeks and follow-up with PCP. - pantoprazole (PROTONIX) 40 MG tablet; Take 1 tablet (40 mg total) by mouth daily.  Dispense: 30 tablet; Refill: 0  2. Vertigo  - meclizine (ANTIVERT) 25 MG tablet; Take 1 tablet (25 mg total) by mouth 3 (three) times daily as needed.  Dispense: 30 tablet; Refill: 0  3. Elevated blood pressure reading - She does check periodically at home with normal readings.  Encouraged to continue to monitor BP at home and if elevated follow-up with PCP   Shirline Frees, NP

## 2023-08-28 NOTE — Telephone Encounter (Signed)
 Pt had a visit with Kandee Keen on 08/27/23 for this problem

## 2024-03-03 DIAGNOSIS — Z0000 Encounter for general adult medical examination without abnormal findings: Secondary | ICD-10-CM | POA: Diagnosis not present

## 2024-03-03 DIAGNOSIS — Z136 Encounter for screening for cardiovascular disorders: Secondary | ICD-10-CM | POA: Diagnosis not present

## 2024-03-03 DIAGNOSIS — Z0189 Encounter for other specified special examinations: Secondary | ICD-10-CM | POA: Diagnosis not present

## 2024-03-03 DIAGNOSIS — Z1159 Encounter for screening for other viral diseases: Secondary | ICD-10-CM | POA: Diagnosis not present

## 2024-03-03 DIAGNOSIS — E559 Vitamin D deficiency, unspecified: Secondary | ICD-10-CM | POA: Diagnosis not present

## 2024-03-03 DIAGNOSIS — Z79899 Other long term (current) drug therapy: Secondary | ICD-10-CM | POA: Diagnosis not present

## 2024-03-03 DIAGNOSIS — R7303 Prediabetes: Secondary | ICD-10-CM | POA: Diagnosis not present

## 2024-03-03 DIAGNOSIS — M25562 Pain in left knee: Secondary | ICD-10-CM | POA: Diagnosis not present

## 2024-03-03 DIAGNOSIS — K219 Gastro-esophageal reflux disease without esophagitis: Secondary | ICD-10-CM | POA: Diagnosis not present

## 2024-03-03 DIAGNOSIS — Z96651 Presence of right artificial knee joint: Secondary | ICD-10-CM | POA: Diagnosis not present

## 2024-03-03 DIAGNOSIS — H6123 Impacted cerumen, bilateral: Secondary | ICD-10-CM | POA: Diagnosis not present

## 2024-03-24 DIAGNOSIS — Z79899 Other long term (current) drug therapy: Secondary | ICD-10-CM | POA: Diagnosis not present

## 2024-03-25 ENCOUNTER — Other Ambulatory Visit (HOSPITAL_BASED_OUTPATIENT_CLINIC_OR_DEPARTMENT_OTHER): Payer: Self-pay | Admitting: Nurse Practitioner

## 2024-03-25 DIAGNOSIS — E2839 Other primary ovarian failure: Secondary | ICD-10-CM

## 2024-07-28 ENCOUNTER — Ambulatory Visit: Payer: Medicare Other
